# Patient Record
Sex: Female | Born: 1980 | Race: White | Hispanic: No | Marital: Married | Smoking: Never smoker
Health system: Southern US, Community
[De-identification: ages and names within clinical notes are randomized; demographics above are authoritative.]

## PROBLEM LIST (undated history)

## (undated) DIAGNOSIS — O26899 Other specified pregnancy related conditions, unspecified trimester: Secondary | ICD-10-CM

## (undated) DIAGNOSIS — Z6791 Unspecified blood type, Rh negative: Secondary | ICD-10-CM

## (undated) DIAGNOSIS — I872 Venous insufficiency (chronic) (peripheral): Secondary | ICD-10-CM

## (undated) HISTORY — DX: Other specified pregnancy related conditions, unspecified trimester: O26.899

## (undated) HISTORY — DX: Venous insufficiency (chronic) (peripheral): I87.2

## (undated) HISTORY — DX: Unspecified blood type, rh negative: Z67.91

## (undated) HISTORY — PX: GALLBLADDER SURGERY: SHX652

---

## 2004-06-04 DIAGNOSIS — O139 Gestational [pregnancy-induced] hypertension without significant proteinuria, unspecified trimester: Secondary | ICD-10-CM

## 2011-07-25 DIAGNOSIS — O0990 Supervision of high risk pregnancy, unspecified, unspecified trimester: Secondary | ICD-10-CM | POA: Insufficient documentation

## 2012-01-08 ENCOUNTER — Observation Stay: Payer: Self-pay | Admitting: Obstetrics and Gynecology

## 2012-01-08 LAB — FETAL FIBRONECTIN
Appearance: NORMAL
Fetal Fibronectin: NEGATIVE

## 2012-01-08 LAB — URINALYSIS, COMPLETE
Bacteria: NONE SEEN
Bilirubin,UR: NEGATIVE
Glucose,UR: NEGATIVE mg/dL (ref 0–75)
Ketone: NEGATIVE
Ph: 6 (ref 4.5–8.0)
RBC,UR: NONE SEEN /HPF (ref 0–5)
Squamous Epithelial: <1
WBC UR: 1 /HPF (ref 0–5)

## 2012-01-09 ENCOUNTER — Ambulatory Visit: Payer: Self-pay

## 2012-02-12 ENCOUNTER — Observation Stay: Payer: Self-pay | Admitting: Obstetrics and Gynecology

## 2012-02-12 LAB — URINALYSIS, COMPLETE
Blood: NEGATIVE
Glucose,UR: NEGATIVE mg/dL (ref 0–75)
Ketone: NEGATIVE
Nitrite: NEGATIVE
Ph: 7 (ref 4.5–8.0)
Protein: NEGATIVE
RBC,UR: NONE SEEN /HPF (ref 0–5)
Specific Gravity: 1.013 (ref 1.003–1.030)

## 2012-03-14 ENCOUNTER — Inpatient Hospital Stay: Payer: Self-pay

## 2012-03-14 LAB — CBC WITH DIFFERENTIAL/PLATELET
Basophil #: 0 10*3/uL (ref 0.0–0.1)
Basophil %: 0.5 %
Eosinophil %: 2.2 %
HCT: 36.6 % (ref 35.0–47.0)
HGB: 12.5 g/dL (ref 12.0–16.0)
Lymphocyte #: 1.4 10*3/uL (ref 1.0–3.6)
Lymphocyte %: 15.2 %
MCH: 31.4 pg (ref 26.0–34.0)
Monocyte #: 0.7 x10 3/mm (ref 0.2–0.9)
Neutrophil #: 7 10*3/uL — ABNORMAL HIGH (ref 1.4–6.5)
Neutrophil %: 74.7 %
RBC: 3.98 10*6/uL (ref 3.80–5.20)
WBC: 9.4 10*3/uL (ref 3.6–11.0)

## 2012-03-15 LAB — HEMATOCRIT: HCT: 29 % — ABNORMAL LOW (ref 35.0–47.0)

## 2012-03-30 ENCOUNTER — Emergency Department: Payer: Self-pay | Admitting: Emergency Medicine

## 2012-03-30 LAB — URINALYSIS, COMPLETE
Bacteria: NONE SEEN
Bilirubin,UR: NEGATIVE
Glucose,UR: NEGATIVE mg/dL (ref 0–75)
Ketone: NEGATIVE
Ph: 6 (ref 4.5–8.0)
RBC,UR: 338 /HPF (ref 0–5)
Specific Gravity: 1.015 (ref 1.003–1.030)
Squamous Epithelial: 3
WBC UR: 1299 /HPF (ref 0–5)

## 2012-03-30 LAB — COMPREHENSIVE METABOLIC PANEL
Albumin: 3.5 g/dL (ref 3.4–5.0)
Alkaline Phosphatase: 76 U/L (ref 50–136)
BUN: 13 mg/dL (ref 7–18)
Bilirubin,Total: 0.5 mg/dL (ref 0.2–1.0)
Chloride: 108 mmol/L — ABNORMAL HIGH (ref 98–107)
Creatinine: 1.04 mg/dL (ref 0.60–1.30)
EGFR (African American): >60
EGFR (Non-African Amer.): >60
Glucose: 95 mg/dL (ref 65–99)
Osmolality: 281 (ref 275–301)
Potassium: 3.8 mmol/L (ref 3.5–5.1)
SGOT(AST): 28 U/L (ref 15–37)
SGPT (ALT): 31 U/L (ref 12–78)
Sodium: 141 mmol/L (ref 136–145)
Total Protein: 7 g/dL (ref 6.4–8.2)

## 2012-03-30 LAB — CBC
HCT: 36.8 % (ref 35.0–47.0)
HGB: 12.8 g/dL (ref 12.0–16.0)
MCH: 31.4 pg (ref 26.0–34.0)
MCHC: 34.7 g/dL (ref 32.0–36.0)
MCV: 91 fL (ref 80–100)
Platelet: 270 10*3/uL (ref 150–440)
RBC: 4.07 10*6/uL (ref 3.80–5.20)

## 2013-04-28 ENCOUNTER — Emergency Department: Payer: Self-pay | Admitting: Emergency Medicine

## 2014-10-10 IMAGING — US US PELV - US TRANSVAGINAL
1 series · 13 of 25 positions shown · non-contrast
Comparison: none

REASON FOR EXAM: pelvic pain
COMMENTS:

PROCEDURE:     US  - US PELVIS EXAM W/TRANSVAGINAL  - March 30, 2012  [DATE]
RESULT:
TECHNIQUE: Transabdominal and transvaginal imaging of the pelvis was
obtained.

[Series 1: us pelv - us transvaginal · 0.30mm/px · 13 of 51 slices shown]
[im 1/51]
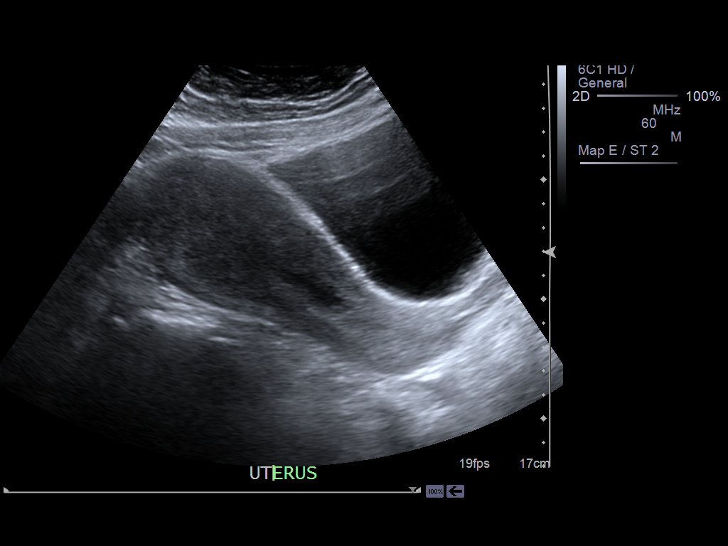
[im 5/51]
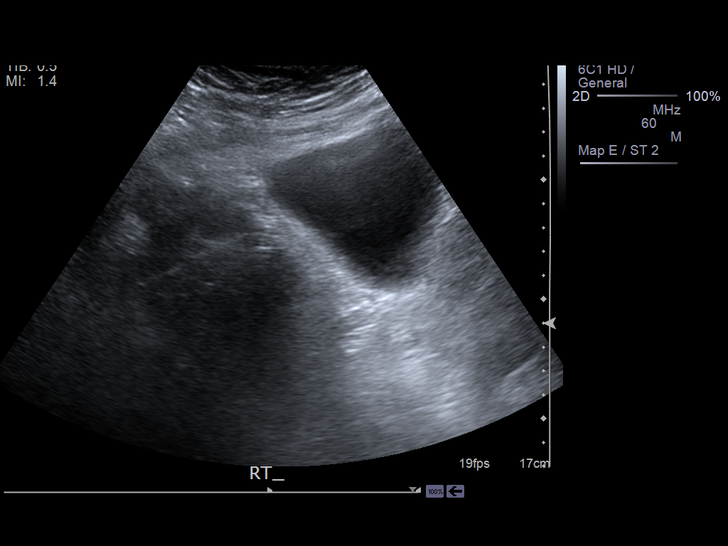
[im 9/51]
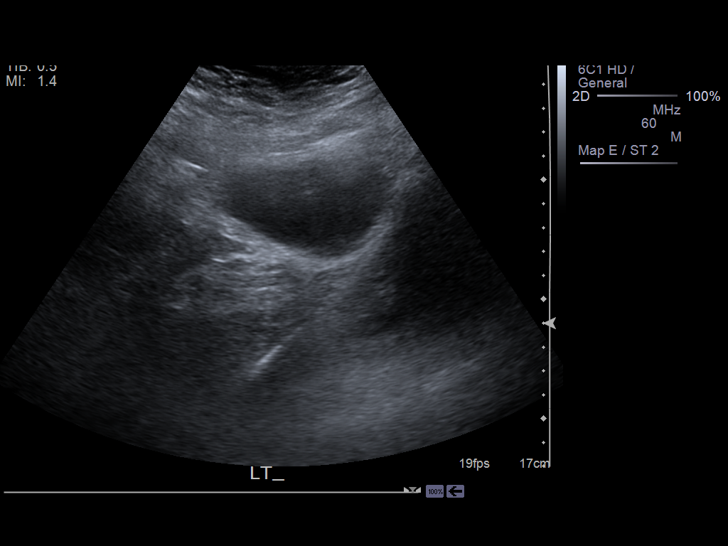
[im 13/51]
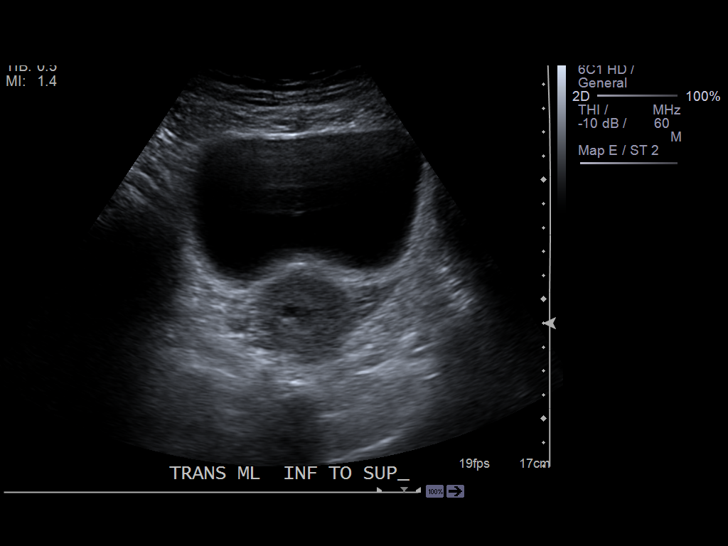
[im 17/51]
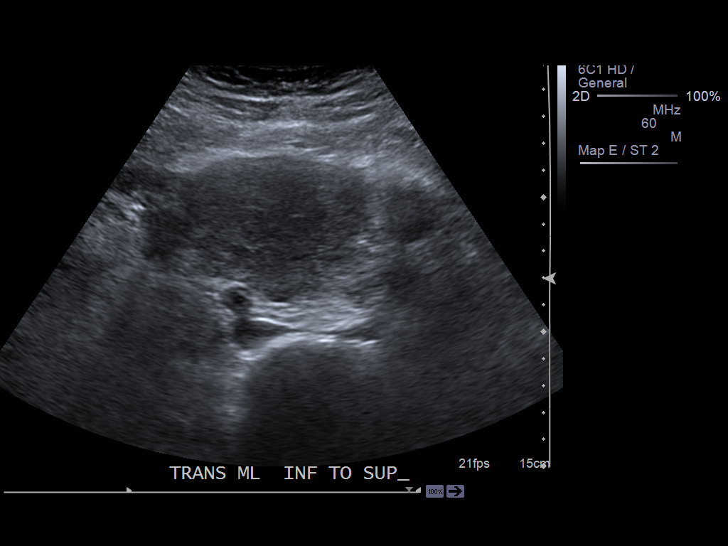
[im 21/51]
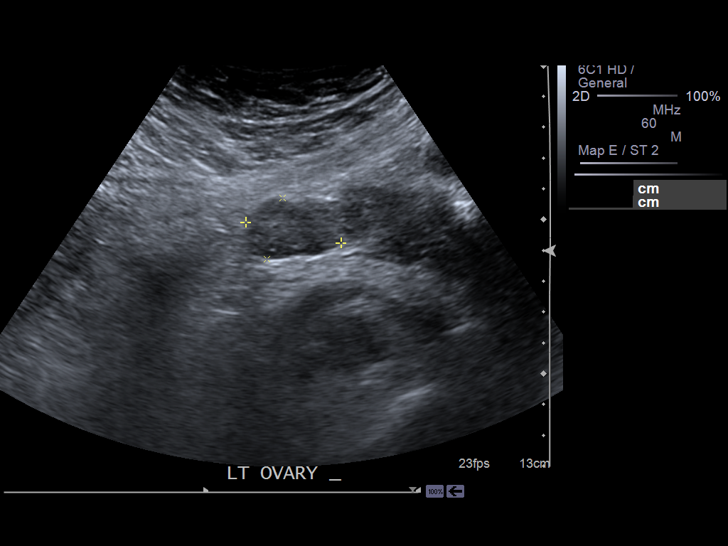
[im 26/51]
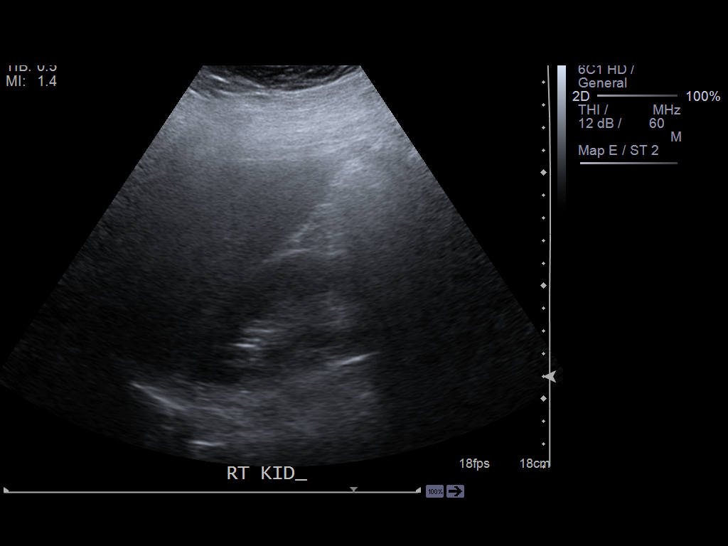
[im 30/51]
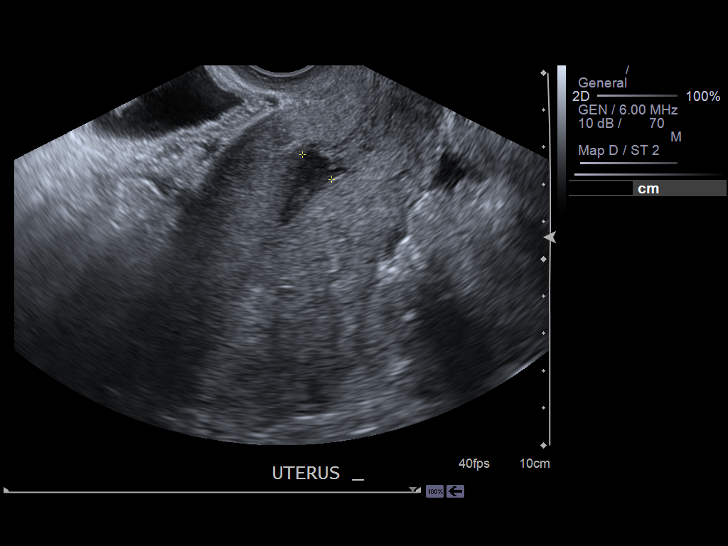
[im 34/51]
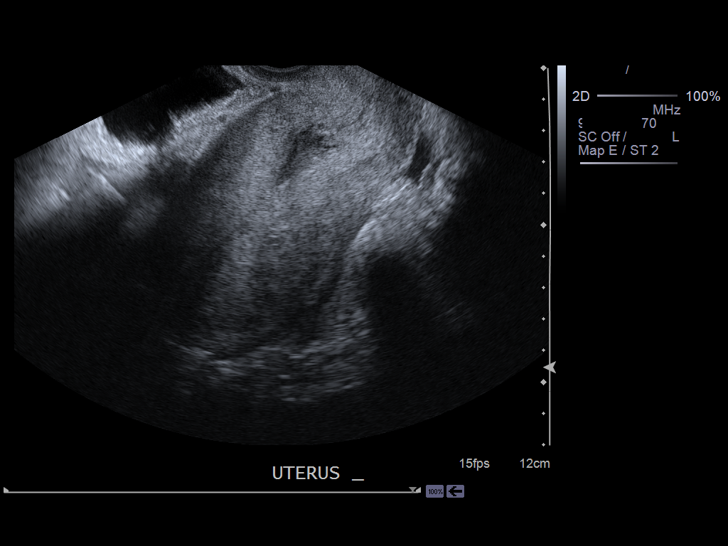
[im 38/51]
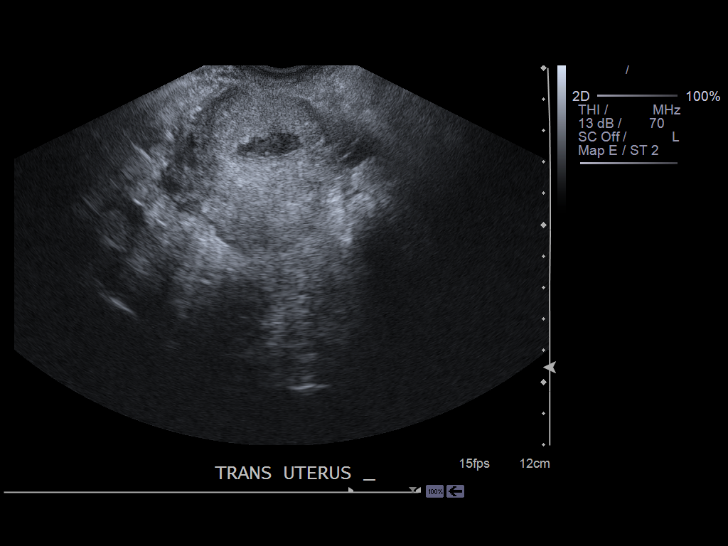
[im 42/51]
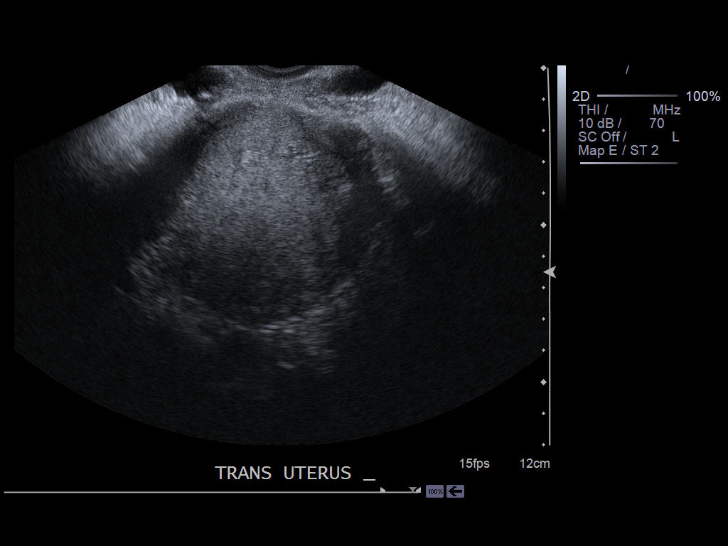
[im 46/51]
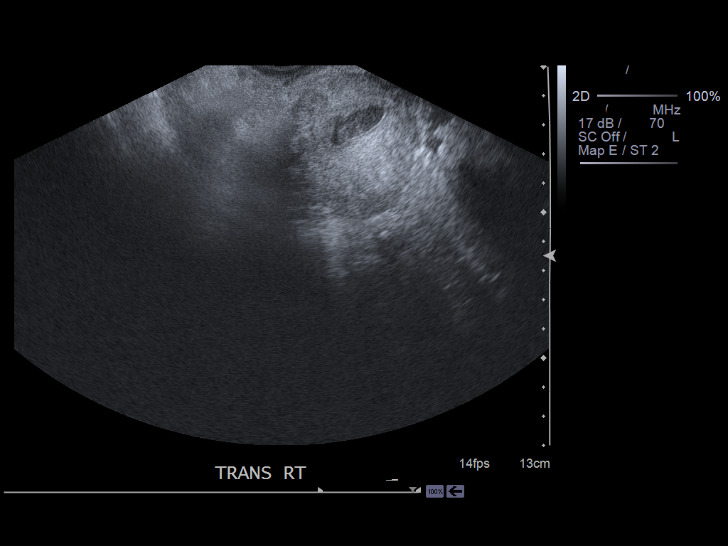
[im 51/51]
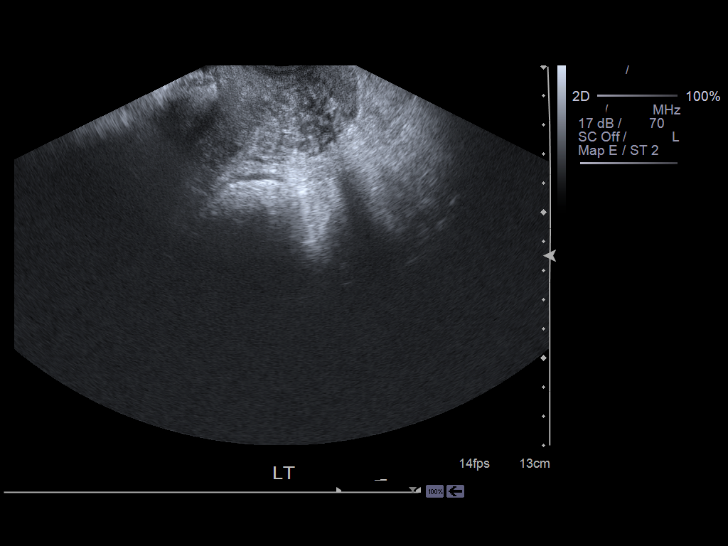

[13 of 25 positions shown; findings below may reference images not displayed]

FINDINGS: The uterus is enlarged consistent with a postpartum state. The
endometrium is thickened with complex echoes in the region of the fundus
measuring approximately 1.5-1.6 cm in thickness.  A complex collection is
identified in the endometrial canal and the lower uterine segment measuring
10 mm. Color Doppler images demonstrate no definitive internal vascularity
within the collection. The right ovary is unremarkable and measures 3.7 x
1.7 x 2.6 cm. The left ovary measures 3.2 x 2.0 x 2.1 cm. Flow is identified
within the ovaries. A trace amount of fluid is appreciated within the
cul-de-sac.
IMPRESSION: 1. Slightly thickened endometrium with complex fluid collection in the
endometrial canal in the lower uterine segment. Differential considerations
include infection, clot or possibly retained products of conception.
Surveillance evaluation recommended if and as clinically warranted.
2. No further sonographic abnormalities. A trace amount of free fluid is
identified within the cul-de-sac.
3. Dr. Nazareth of the Emergency Department was informed of these findings
via a preliminary faxed report.

## 2014-10-12 NOTE — H&P (Signed)
L&D Evaluation:  History Expanded:   HPI 34 yo G4P1021 who was seen in the office today and was found to be 2-3 cm dilated, and sent fromt he office. she has not had any problems with this pregnancy ashe is on her feet all day but with light dutyshe is having pressure and cramping. FFn was founf to be neg today.    Gravida 4    Term 1    PreTerm 0    Abortion 2    Living 1    Blood Type (Maternal) A negative    Group B Strep Results Maternal (Result >5wks must be treated as unknown) unknown/result > 5 weeks ago    Maternal HIV Negative    Maternal Syphilis Ab Nonreactive    Maternal Varicella Non-Immune    Rubella Results (Maternal) immune    Maternal T-Dap Unknown    [facility]    Presents with abdominal pain, contractions    Patient's Medical History No Chronic Illness    Patient's Surgical History none    Medications Pre Natal Vitamins    Allergies NKDA    Social History none    Family History Non-Contributory   ROS:   ROS All systems were reviewed.  HEENT, CNS, GI, GU, Respiratory, CV, Renal and Musculoskeletal systems were found to be normal.   Exam:   Vital Signs stable    Urine Protein negative dipstick    General no apparent distress    Mental Status clear    Chest clear    Heart normal sinus rhythm    Abdomen gravid, non-tender    Estimated Fetal Weight Average for gestational age    Pelvic no external lesions, 2 external    Mebranes Intact    FHT strictly reactive no decels or variables, no contractions seen    Ucx absent    Skin dry    Lymph no lymphadenopathy   Impression:   Impression PTL   Plan:   Plan monitor contractions and for cervical change    Comments give steroids proophy;lactically given dilation, return on 8th and 15th adn shoujld be able to go tovacation adn she shouldbe able to work on a limited basis pelvic rest rec. and given watery discharge will give rx for flagyl for presumed BV    Follow Up  Appointment already scheduled   Electronic Signatures: Adria DevonKlett, Kenroy Timberman (MD)  (Signed 06-Aug-13 17:59)  Authored: L&D Evaluation   Last Updated: 06-Aug-13 17:59 by Adria DevonKlett, Florence Yeung (MD)

## 2014-10-12 NOTE — H&P (Signed)
L&D Evaluation:  History Expanded:   HPI 34 yo G4P1021 with EDD of 03/09/12 presents at 40 5/7 weeks with c/o leaking fluid and regular contractions. [facility] for tx of care at 16 weeks, episode of PTL around 31 weeks with steroids given, RH negative with Rhogam given at 28 weeks. H/o PIH with last delivery.    Gravida 4    Term 1    PreTerm 0    Abortion 2    Living 1    Blood Type (Maternal) A negative    Group B Strep Results Maternal (Result >5wks must be treated as unknown) unknown/result > 5 weeks ago    Maternal HIV Negative    Maternal Syphilis Ab Nonreactive    Maternal Varicella Unknown    Rubella Results (Maternal) immune    Maternal T-Dap Unknown    [facility]    Presents with abdominal pain, contractions    Patient's Medical History No Chronic Illness  varicose veins    Patient's Surgical History Colecystectomy    Medications Pre Natal Vitamins    Allergies NKDA    Social History none    Family History Non-Contributory   ROS:   ROS see HPI   Exam:   Vital Signs stable    General no apparent distress    Mental Status clear    Chest clear    Heart normal sinus rhythm    Abdomen gravid, tender with contractions    Estimated Fetal Weight Average for gestational age    Edema 2+ pitting edema on LLE, 1+ RLE    Reflexes 1+    Pelvic no external lesions, 4 cm per RN    Mebranes Intact    FHT normal rate with no decels    Ucx regular, q 3-4 min    Skin dry    Lymph no lymphadenopathy   Impression:   Impression early labor   Plan:   Plan monitor contractions and for cervical change    Comments Admission for delivery May ambulate in hallways once NST reactive IV medication or epidural as desired   Electronic Signatures: Vella KohlerBrothers, Jamis Kryder K (CNM)  (Signed 11-Oct-13 05:31)  Authored: L&D Evaluation   Last Updated: 11-Oct-13 05:31 by Vella KohlerBrothers, Vilda Zollner K (CNM)

## 2016-11-09 ENCOUNTER — Ambulatory Visit (INDEPENDENT_AMBULATORY_CARE_PROVIDER_SITE_OTHER): Payer: BC Managed Care – PPO | Admitting: Certified Nurse Midwife

## 2016-11-09 ENCOUNTER — Encounter: Payer: Self-pay | Admitting: Certified Nurse Midwife

## 2016-11-09 VITALS — BP 110/72 | HR 75 | Ht 67.0 in | Wt 215.8 lb

## 2016-11-09 DIAGNOSIS — N340 Urethral abscess: Secondary | ICD-10-CM | POA: Diagnosis not present

## 2016-11-09 MED ORDER — SULFAMETHOXAZOLE-TRIMETHOPRIM 800-160 MG PO TABS: 1.0000 | ORAL_TABLET | Freq: Two times a day (BID) | ORAL | 0 refills | Status: AC

## 2016-11-09 MED ORDER — LIDOCAINE HCL 2 % EX GEL: 1.0000 "application " | CUTANEOUS | 2 refills | Status: DC | PRN

## 2016-11-09 NOTE — Patient Instructions (Addendum)
Bartholin Cyst or Abscess A Bartholin cyst is a fluid-filled sac that forms on a Bartholin gland. Bartholin glands are small glands that are located within the folds of skin (labia) along the sides of the lower opening of the vagina. These glands produce a fluid to moisten the outside of the vagina during sexual intercourse. A Bartholin cyst causes a bulge on the side of the vagina. A cyst that is not large or infected may not cause symptoms or problems. However, if the fluid within the cyst becomes infected, the cyst can turn into an abscess. An abscess may cause discomfort or pain. What are the causes? A Bartholin cyst may develop when the duct of the gland becomes blocked. In many cases, the cause of this is not known. Various kinds of bacteria can cause the cyst to become infected and develop into an abscess. What increases the risk? You may be at an increased risk of developing a Bartholin cyst or abscess if:  You are a woman of reproductive age.  You have a history of previous Bartholin cysts or abscesses.  You have diabetes.  You have a sexually transmitted disease (STD).  What are the signs or symptoms? The severity of symptoms varies depending on the size of the cyst and whether it is infected. Symptoms may include:  A bulge or swelling near the lower opening of your vagina.  Discomfort or pain.  Redness.  Pain during sexual intercourse.  Pain when walking.  Fluid draining from the area.  How is this diagnosed? Your health care provider may make a diagnosis based on your symptoms and a physical exam. He or she will look for swelling in your vaginal area. Blood tests may be done to check for infections. A sample of fluid from the cyst or abscess may also be taken to be tested in a lab. How is this treated? Small cysts that are not infected may not require any treatment. These often go away on their own. Yourhealth care provider will recommend hot baths and the use of warm  compresses. These may also be part of the treatment for an abscess. Treatment options for a large cyst or abscess may include:  Antibiotic medicine.  A surgical procedure to drain the abscess. One of the following procedures may be done: ? Incision and drainage. An incision is made in the cyst or abscess so that the fluid drains out. A catheter may be placed inside the cyst so that it does not close and fill up with fluid again. The catheter will be removed after you have a follow-up visit with a specialist (gynecologist). ? Marsupialization. The cyst or abscess is opened and kept open by stitching the edges of the skin to the walls of the cyst or abscess. This allows it to continue to drain and not fill up with fluid again.  If you have cysts or abscesses that keep returning and have required incision and drainage multiple times, your health care provider may talk to you about surgery to remove the Bartholin gland. Follow these instructions at home:  Take medicines only as directed by your health care provider.  If you were prescribed an antibiotic medicine, finish it all even if you start to feel better.  Apply warm, wet compresses to the area or take warm, shallow baths that cover your pelvic region (sitz baths) several times a day or as directed by your health care provider.  Do not squeeze the cyst or apply heavy pressure to it.    Do not have sexual intercourse until the cyst has gone away.  If your cyst or abscess was opened, a small piece of gauze or a drain may have been placed in the area to allow drainage. Do not remove the gauze or the drain until directed by your health care provider.  Wear feminine pads-not tampons-as needed for any drainage or bleeding.  Keep all follow-up visits as directed by your health care provider. This is important. How is this prevented? Take these steps to help prevent a Bartholin cyst from returning:  Practice good hygiene.  Clean your vaginal  area with mild soap and a soft cloth when you bathe.  Practice safe sex to prevent STDs.  Contact a health care provider if:  You have increased pain, swelling, or redness in the area of the cyst.  Puslike drainage is coming from the cyst.  You have a fever. This information is not intended to replace advice given to you by your health care provider. Make sure you discuss any questions you have with your health care provider. Document Released: 05/21/2005 Document Revised: 10/27/2015 Document Reviewed: 01/04/2014 Elsevier Interactive Patient Education  2018 Elsevier Inc. Sulfamethoxazole; Trimethoprim, SMX-TMP tablets What is this medicine? SULFAMETHOXAZOLE; TRIMETHOPRIM or SMX-TMP (suhl fuh meth OK suh zohl; trye METH oh prim) is a combination of a sulfonamide antibiotic and a second antibiotic, trimethoprim. It is used to treat or prevent certain kinds of bacterial infections. It will not work for colds, flu, or other viral infections. This medicine may be used for other purposes; ask your health care provider or pharmacist if you have questions. COMMON BRAND NAME(S): Bacter-Aid DS, Bactrim, Bactrim DS, Septra, Septra DS What should I tell my health care provider before I take this medicine? They need to know if you have any of these conditions: -anemia -asthma -being treated with anticonvulsants -if you frequently drink alcohol containing drinks -kidney disease -liver disease -low level of folic acid or ZOXWRUE-4-[id number] dehydrogenase -poor nutrition or malabsorption -porphyria -severe allergies -thyroid disorder -an unusual or allergic reaction to sulfamethoxazole, trimethoprim, sulfa drugs, other medicines, foods, dyes, or preservatives -pregnant or trying to get pregnant -breast-feeding How should I use this medicine? Take this medicine by mouth with a full glass of water. Follow the directions on the prescription label. Take your medicine at regular intervals. Do not  take it more often than directed. Do not skip doses or stop your medicine early. Talk to your pediatrician regarding the use of this medicine in children. Special care may be needed. This medicine has been used in children as young as 56 months of age. Overdosage: If you think you have taken too much of this medicine contact a poison control center or emergency room at once. NOTE: This medicine is only for you. Do not share this medicine with others. What if I miss a dose? If you miss a dose, take it as soon as you can. If it is almost time for your next dose, take only that dose. Do not take double or extra doses. What may interact with this medicine? Do not take this medicine with any of the following medications: -aminobenzoate potassium -dofetilide -metronidazole This medicine may also interact with the following medications: -ACE inhibitors like benazepril, enalapril, lisinopril, and ramipril -birth control pills -cyclosporine -digoxin -diuretics -indomethacin -medicines for diabetes -methenamine -methotrexate -phenytoin -potassium supplements -pyrimethamine -sulfinpyrazone -tricyclic antidepressants -warfarin This list may not describe all possible interactions. Give your health care provider a list of all the medicines, herbs, non-prescription  drugs, or dietary supplements you use. Also tell them if you smoke, drink alcohol, or use illegal drugs. Some items may interact with your medicine. What should I watch for while using this medicine? Tell your doctor or health care professional if your symptoms do not improve. Drink several glasses of water a day to reduce the risk of kidney problems. Do not treat diarrhea with over the counter products. Contact your doctor if you have diarrhea that lasts more than 2 days or if it is severe and watery. This medicine can make you more sensitive to the sun. Keep out of the sun. If you cannot avoid being in the sun, wear protective clothing  and use a sunscreen. Do not use sun lamps or tanning beds/booths. What side effects may I notice from receiving this medicine? Side effects that you should report to your doctor or health care professional as soon as possible: -allergic reactions like skin rash or hives, swelling of the face, lips, or tongue -breathing problems -fever or chills, sore throat -irregular heartbeat, chest pain -joint or muscle pain -pain or difficulty passing urine -red pinpoint spots on skin -redness, blistering, peeling or loosening of the skin, including inside the mouth -unusual bleeding or bruising -unusually weak or tired -yellowing of the eyes or skin Side effects that usually do not require medical attention (report to your doctor or health care professional if they continue or are bothersome): -diarrhea -dizziness -headache -loss of appetite -nausea, vomiting -nervousness This list may not describe all possible side effects. Call your doctor for medical advice about side effects. You may report side effects to FDA at 1-800-FDA-1088. Where should I keep my medicine? Keep out of the reach of children. Store at room temperature between 20 to 25 degrees C (68 to 77 degrees F). Protect from light. Throw away any unused medicine after the expiration date. NOTE: This sheet is a summary. It may not cover all possible information. If you have questions about this medicine, talk to your doctor, pharmacist, or health care provider.  2018 Elsevier/Gold Standard (2012-12-26 14:38:26)   How to Take a Sitz Bath A sitz bath is a warm water bath that is taken while you are sitting down. The water should only come up to your hips and should cover your buttocks. Your health care provider may recommend a sitz bath to help you:  Clean the lower part of your body, including your genital area.  With itching.  With pain.  With sore muscles or muscles that tighten or spasm.  How to take a sitz bath Take 3-4  sitz baths per day or as told by your health care provider. 1. Partially fill a bathtub with warm water. You will only need the water to be deep enough to cover your hips and buttocks when you are sitting in it. 2. If your health care provider told you to put medicine in the water, follow the directions exactly. 3. Sit in the water and open the tub drain a little. 4. Turn on the warm water again to keep the tub at the correct level. Keep the water running constantly. 5. Soak in the water for 15-20 minutes or as told by your health care provider. 6. After the sitz bath, pat the affected area dry first. Do not rub it. 7. Be careful when you stand up after the sitz bath because you may feel dizzy.  Contact a health care provider if:  Your symptoms get worse. Do not continue with sitz baths  if your symptoms get worse.  You have new symptoms. Do not continue with sitz baths until you talk with your health care provider. This information is not intended to replace advice given to you by your health care provider. Make sure you discuss any questions you have with your health care provider. Document Released: 02/11/2004 Document Revised: 10/19/2015 Document Reviewed: 05/19/2014 Elsevier Interactive Patient Education  [employer]2018 Elsevier Inc.

## 2016-11-11 NOTE — Progress Notes (Signed)
Nicole Jarvis is a 36 y.o. year old [id number] Caucasian female who presents for incision and drainage of right skene's gland abscess. She reports a painful, tender, red, dime sized "bump" on her right vulva. She reports minimal relief with home measures.   No LMP recorded (lmp unknown). BP 110/72   Pulse 75   Ht 5\' 7"  (1.702 m)   Wt 215 lb 12.8 oz (97.9 kg)   LMP  (LMP Unknown) Comment: mirena- placed -2014  BMI 33.80 kg/m   Verbal consent was obtained for incision and drainage of right skene's gland abscess. A time out was preformed.   The area was cleansed with betadine and anesthetized with 2cc of 2% Lidocaine.  The area was cleansed again with betadine and two (2) "pinprick" incisions were created at the top and bottom of the abscess. The purulent material was expressed. Swab obtained and culture sent to lab. Bleeding was minimal. A wet to dry gauze dressing was applied.  The patient tolerated the procedure well without complications. Standard post procedure care and return precautions explained.   Rx Bactrim and Lidocaine jelly, see orders.   RTC x 2-3 days for wound/incision check.    Gunnar BullaJenkins Michelle Toua Stites, CNM

## 2016-11-13 LAB — ANAEROBIC AND AEROBIC CULTURE

## 2016-11-13 LAB — GENITAL CULTURE

## 2018-04-01 ENCOUNTER — Ambulatory Visit (INDEPENDENT_AMBULATORY_CARE_PROVIDER_SITE_OTHER): Payer: BC Managed Care – PPO | Admitting: Certified Nurse Midwife

## 2018-04-01 VITALS — BP 118/71 | HR 75 | Ht 67.0 in | Wt 217.3 lb

## 2018-04-01 DIAGNOSIS — Z30432 Encounter for removal of intrauterine contraceptive device: Secondary | ICD-10-CM

## 2018-04-01 NOTE — Patient Instructions (Signed)

## 2018-04-01 NOTE — Progress Notes (Signed)
  GYNECOLOGY OFFICE PROCEDURE NOTE  PIERA DOWNS is a 37 y.o. [phone] here for Mirena IUD removal. No GYN concerns.  Pap: its been a while. Has appointment in a week for annual physical exam.   IUD Removal  Patient identified, informed consent performed, consent signed.  Patient was in the dorsal lithotomy position, normal external genitalia was noted.  A speculum was placed in the patient's vagina, normal discharge was noted, no lesions. The cervix was visualized, no lesions, no abnormal discharge.  The strings of the IUD were grasped and pulled using ring forceps. The IUD was removed in its entirety. Patient tolerated the procedure well.    . She is undecided on birth control at this time. Reviewed Patch, Pill, ring, depo, nexplanon, family planning, condoms, and sterilization.Patient will use condoms for contraception   Follow up as scheduled for annual exam. Routine preventative health maintenance measures emphasized.  Doreene Burke, CNM

## 2018-04-08 ENCOUNTER — Encounter: Payer: BC Managed Care – PPO | Admitting: Certified Nurse Midwife

## 2018-04-14 ENCOUNTER — Other Ambulatory Visit (HOSPITAL_COMMUNITY)
Admission: RE | Admit: 2018-04-14 | Discharge: 2018-04-14 | Disposition: A | Discharge status: Home / Self Care | Payer: BC Managed Care – PPO | Source: Ambulatory Visit | Attending: Certified Nurse Midwife | Admitting: Certified Nurse Midwife

## 2018-04-14 ENCOUNTER — Ambulatory Visit (INDEPENDENT_AMBULATORY_CARE_PROVIDER_SITE_OTHER): Payer: BC Managed Care – PPO | Admitting: Certified Nurse Midwife

## 2018-04-14 ENCOUNTER — Encounter: Payer: Self-pay | Admitting: Certified Nurse Midwife

## 2018-04-14 VITALS — BP 120/80 | HR 74 | Ht 67.0 in | Wt 218.4 lb

## 2018-04-14 DIAGNOSIS — Z124 Encounter for screening for malignant neoplasm of cervix: Secondary | ICD-10-CM

## 2018-04-14 DIAGNOSIS — Z01419 Encounter for gynecological examination (general) (routine) without abnormal findings: Secondary | ICD-10-CM | POA: Diagnosis not present

## 2018-04-14 NOTE — Progress Notes (Signed)
GYNECOLOGY ANNUAL PREVENTATIVE CARE ENCOUNTER NOTE  Subjective:   Nicole Jarvis is a 37 y.o. [phone] female here for a routine annual gynecologic exam.  Current complaints: none.   Denies abnormal vaginal bleeding, discharge, pelvic pain, problems with intercourse or other gynecologic concerns. 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    Gynecologic History Patient's last menstrual period was 03/26/2018 (approximate). Contraception: none Last Pap: 2014. Results were: normal per pt Last mammogram: N/A  Obstetric History OB History  Gravida Para Term Preterm AB Living  4 2 2   2 2   SAB TAB Ectopic Multiple Live Births  2       2    # Outcome Date GA Lbr Len/2nd Weight Sex Delivery Anes PTL Lv  4 Term 2013   8 lb 12.8 oz (3.992 kg) M Vag-Spont   LIV  3 SAB 2012          2 Term 2006   5 lb 6.4 oz (2.449 kg) M Vag-Spont   LIV     Complications: Pregnancy induced hypertension  1 SAB 2005            Past Medical History:  Diagnosis Date  . Rh negative, maternal     Past Surgical History:  Procedure Laterality Date  . GALLBLADDER SURGERY      No current outpatient medications on file prior to visit.   No current facility-administered medications on file prior to visit.     No Known Allergies  Social History:  reports that she has never smoked. She has never used smokeless tobacco. She reports that she drinks alcohol. She reports that she does not use drugs. Denies drugs, alcohol, and smoking. Exercises 2-3 times a week. Eats "clean diet" and uses meal planning.   She is a Dentist  Family History  Problem Relation Age of Onset  . Colon cancer Maternal Grandmother   . Breast cancer Neg Hx   . Ovarian cancer Neg Hx   . Diabetes Neg Hx   . Heart disease Neg Hx     The following portions of the patient's history were reviewed and updated as appropriate: allergies, current medications, past family history, past medical history,  past social history, past surgical history and problem list.  Review of Systems Pertinent items noted in HPI and remainder of comprehensive ROS otherwise negative.   Objective:  BP 120/80   Pulse 74   Ht 5\' 7"  (1.702 m)   Wt 218 lb 6 oz (99.1 kg)   LMP 03/26/2018 (Approximate)   BMI 34.20 kg/m  CONSTITUTIONAL: Well-developed, well-nourished female in no acute distress.  HENT:  Normocephalic, atraumatic, External right and left ear normal. Oropharynx is clear and moist EYES: Conjunctivae and EOM are normal. Pupils are equal, round, and reactive to light. No scleral icterus.  NECK: Normal range of motion, supple, no masses.  Normal thyroid.  SKIN: Skin is warm and dry. No rash noted. Not diaphoretic. No erythema. No pallor. MUSCULOSKELETAL: Normal range of motion. No tenderness.  No cyanosis, clubbing, or edema.  2+ distal pulses. NEUROLOGIC: Alert and oriented to person, place, and time. Normal reflexes, muscle tone coordination. No cranial nerve deficit noted. PSYCHIATRIC: Normal mood and affect. Normal behavior. Normal judgment and thought content. CARDIOVASCULAR: Normal heart rate noted, regular rhythm RESPIRATORY: Clear to auscultation bilaterally. Effort and breath sounds normal, no problems with respiration noted. BREASTS: Symmetric in size. No masses, skin changes, nipple drainage, or lymphadenopathy. ABDOMEN:  Soft, normal bowel sounds, no distention noted.  No tenderness, rebound or guarding.  PELVIC: Normal appearing external genitalia; normal appearing vaginal mucosa and cervix.  No abnormal discharge noted.  Pap smear obtained. Contract bleeding present. Normal uterine size, no other palpable masses, no uterine or adnexal tenderness.    Assessment and Plan:  Well women exam Will follow up results of pap smear and manage accordingly. Mammogram not indicated Labs: pt states she has had lab (lipid profile, thyroid) completed at PCP Discussed PNV and extra folic acid for  pregnancy. Samples given.  Routine preventative health maintenance measures emphasized. Please refer to After Visit Summary for other counseling recommendations.    Doreene Burke, CNM

## 2018-04-14 NOTE — Patient Instructions (Signed)
Preventive Care 18-39 Years, Female Preventive care refers to lifestyle choices and visits with your health care provider that can promote health and wellness. What does preventive care include?  A yearly physical exam. This is also called an annual well check.  Dental exams once or twice a year.  Routine eye exams. Ask your health care provider how often you should have your eyes checked.  Personal lifestyle choices, including: ? Daily care of your teeth and gums. ? Regular physical activity. ? Eating a healthy diet. ? Avoiding tobacco and drug use. ? Limiting alcohol use. ? Practicing safe sex. ? Taking vitamin and mineral supplements as recommended by your health care provider. What happens during an annual well check? The services and screenings done by your health care provider during your annual well check will depend on your age, overall health, lifestyle risk factors, and family history of disease. Counseling Your health care provider may ask you questions about your:  Alcohol use.  Tobacco use.  Drug use.  Emotional well-being.  Home and relationship well-being.  Sexual activity.  Eating habits.  Work and work Statistician.  Method of birth control.  Menstrual cycle.  Pregnancy history.  Screening You may have the following tests or measurements:  Height, weight, and BMI.  Diabetes screening. This is done by checking your blood sugar (glucose) after you have not eaten for a while (fasting).  Blood pressure.  Lipid and cholesterol levels. These may be checked every 5 years starting at age 66.  Skin check.  Hepatitis C blood test.  Hepatitis B blood test.  Sexually transmitted disease (STD) testing.  BRCA-related cancer screening. This may be done if you have a family history of breast, ovarian, tubal, or peritoneal cancers.  Pelvic exam and Pap test. This may be done every 3 years starting at age 40. Starting at age 59, this may be done every 5  years if you have a Pap test in combination with an HPV test.  Discuss your test results, treatment options, and if necessary, the need for more tests with your health care provider. Vaccines Your health care provider may recommend certain vaccines, such as:  Influenza vaccine. This is recommended every year.  Tetanus, diphtheria, and acellular pertussis (Tdap, Td) vaccine. You may need a Td booster every 10 years.  Varicella vaccine. You may need this if you have not been vaccinated.  HPV vaccine. If you are 69 or younger, you may need three doses over 6 months.  Measles, mumps, and rubella (MMR) vaccine. You may need at least one dose of MMR. You may also need a second dose.  Pneumococcal 13-valent conjugate (PCV13) vaccine. You may need this if you have certain conditions and were not previously vaccinated.  Pneumococcal polysaccharide (PPSV23) vaccine. You may need one or two doses if you smoke cigarettes or if you have certain conditions.  Meningococcal vaccine. One dose is recommended if you are age 27-21 years and a first-year college student living in a residence hall, or if you have one of several medical conditions. You may also need additional booster doses.  Hepatitis A vaccine. You may need this if you have certain conditions or if you travel or work in places where you may be exposed to hepatitis A.  Hepatitis B vaccine. You may need this if you have certain conditions or if you travel or work in places where you may be exposed to hepatitis B.  Haemophilus influenzae type b (Hib) vaccine. You may need this if  you have certain risk factors.  Talk to your health care provider about which screenings and vaccines you need and how often you need them. This information is not intended to replace advice given to you by your health care provider. Make sure you discuss any questions you have with your health care provider. Document Released: 07/17/2001 Document Revised: 02/08/2016  Document Reviewed: 03/22/2015 Elsevier Interactive Patient Education  [employer].

## 2018-04-17 LAB — CYTOLOGY - PAP
DIAGNOSIS: UNDETERMINED — AB
HPV (WINDOPATH): NOT DETECTED

## 2018-07-31 ENCOUNTER — Encounter: Payer: Self-pay | Admitting: Emergency Medicine

## 2018-07-31 ENCOUNTER — Emergency Department: Payer: BC Managed Care – PPO

## 2018-07-31 ENCOUNTER — Other Ambulatory Visit: Payer: Self-pay

## 2018-07-31 ENCOUNTER — Emergency Department
Admission: EM | Admit: 2018-07-31 | Discharge: 2018-07-31 | Disposition: A | Discharge status: Home / Self Care | Payer: BC Managed Care – PPO | Attending: Emergency Medicine | Admitting: Emergency Medicine

## 2018-07-31 DIAGNOSIS — R1030 Lower abdominal pain, unspecified: Secondary | ICD-10-CM | POA: Diagnosis not present

## 2018-07-31 DIAGNOSIS — N83209 Unspecified ovarian cyst, unspecified side: Secondary | ICD-10-CM | POA: Diagnosis not present

## 2018-07-31 DIAGNOSIS — R109 Unspecified abdominal pain: Secondary | ICD-10-CM | POA: Diagnosis present

## 2018-07-31 LAB — URINALYSIS, COMPLETE (UACMP) WITH MICROSCOPIC
Bilirubin Urine: NEGATIVE
Glucose, UA: NEGATIVE mg/dL
Ketones, ur: NEGATIVE mg/dL
Nitrite: NEGATIVE
Protein, ur: NEGATIVE mg/dL
Specific Gravity, Urine: 1.005 (ref 1.005–1.030)
pH: 6 (ref 5.0–8.0)

## 2018-07-31 LAB — CBC
HCT: 36.8 % (ref 36.0–46.0)
Hemoglobin: 12.6 g/dL (ref 12.0–15.0)
MCH: 32 pg (ref 26.0–34.0)
MCHC: 34.2 g/dL (ref 30.0–36.0)
MCV: 93.4 fL (ref 80.0–100.0)
Platelets: 202 10*3/uL (ref 150–400)
RBC: 3.94 MIL/uL (ref 3.87–5.11)
RDW: 11.6 % (ref 11.5–15.5)
WBC: 7.1 10*3/uL (ref 4.0–10.5)
nRBC: 0 % (ref 0.0–0.2)

## 2018-07-31 LAB — COMPREHENSIVE METABOLIC PANEL
ALT: 15 U/L (ref 0–44)
AST: 19 U/L (ref 15–41)
Albumin: 4.1 g/dL (ref 3.5–5.0)
Alkaline Phosphatase: 30 U/L — ABNORMAL LOW (ref 38–126)
Anion gap: 4 — ABNORMAL LOW (ref 5–15)
BUN: 20 mg/dL (ref 6–20)
CO2: 26 mmol/L (ref 22–32)
Calcium: 8.7 mg/dL — ABNORMAL LOW (ref 8.9–10.3)
Chloride: 106 mmol/L (ref 98–111)
Creatinine, Ser: 0.66 mg/dL (ref 0.44–1.00)
GFR calc Af Amer: >60 mL/min (ref >60)
GFR calc non Af Amer: >60 mL/min (ref >60)
Glucose, Bld: 88 mg/dL (ref 70–99)
Potassium: 4.1 mmol/L (ref 3.5–5.1)
SODIUM: 136 mmol/L (ref 135–145)
Total Bilirubin: 0.5 mg/dL (ref 0.3–1.2)
Total Protein: 6.6 g/dL (ref 6.5–8.1)

## 2018-07-31 LAB — LIPASE, BLOOD: Lipase: 31 U/L (ref 11–51)

## 2018-07-31 MED ORDER — OXYCODONE-ACETAMINOPHEN 7.5-325 MG PO TABS: 1.0000 | ORAL_TABLET | Freq: Four times a day (QID) | ORAL | 0 refills | Status: DC | PRN

## 2018-07-31 MED ORDER — KETOROLAC TROMETHAMINE 30 MG/ML IJ SOLN
30.0000 mg | Freq: Once | INTRAMUSCULAR | Status: AC
  Administered 2018-07-31: 30 mg via INTRAVENOUS
  Filled 2018-07-31: qty 1

## 2018-07-31 MED ORDER — ONDANSETRON HCL 4 MG/2ML IJ SOLN
4.0000 mg | Freq: Once | INTRAMUSCULAR | Status: AC
  Administered 2018-07-31: 4 mg via INTRAVENOUS
  Filled 2018-07-31: qty 2

## 2018-07-31 MED ORDER — HYDROMORPHONE HCL 1 MG/ML IJ SOLN
1.0000 mg | Freq: Once | INTRAMUSCULAR | Status: AC
  Administered 2018-07-31: 1 mg via INTRAVENOUS
  Filled 2018-07-31: qty 1

## 2018-07-31 MED ORDER — SODIUM CHLORIDE 0.9% FLUSH: 3.0000 mL | Freq: Once | INTRAVENOUS | Status: DC

## 2018-07-31 NOTE — ED Triage Notes (Addendum)
PT c/o RT flank pain xfew days with urinary frequency. PT also states intermit sharp pain through lower abd. Denies n/v/d . VSS NAD noted

## 2018-07-31 NOTE — Discharge Instructions (Signed)
Follow-up with gynecologist or family doctor in 3 to 5 days.

## 2018-07-31 NOTE — ED Notes (Signed)
See triage note; flank pain + dysuria since last night.

## 2018-07-31 NOTE — ED Provider Notes (Signed)
[facility] Emergency Department Provider Note   ____________________________________________   First MD Initiated Contact with Patient 07/31/18 1725     (approximate)  I have reviewed the triage vital signs and the nursing notes.   HISTORY  Chief Complaint Flank Pain    HPI Nicole Jarvis is a 38 y.o. female patient presents with right flank pain for few days.  Patient state in the last 24 hours she had urinary frequency.  Patient had a pain radiates from the right flank into the lower abdomen.  Patient denies nausea, vomiting, diarrhea.  Patient denies provoking incident for complaint.  Patient rates the pain as a 5/10.  Patient described the pain is "achy".  Patient has taken ibuprofen with mild transient relief.    Past Medical History:  Diagnosis Date  . Rh negative, maternal     There are no active problems to display for this patient.   Past Surgical History:  Procedure Laterality Date  . GALLBLADDER SURGERY      Prior to Admission medications   Medication Sig Start Date End Date Taking? Authorizing Provider  albuterol (PROVENTIL) (2.5 MG/3ML) 0.083% nebulizer solution Take 2.5 mg by nebulization every 6 (six) hours as needed for wheezing or shortness of breath.    [provider]  oxyCODONE-acetaminophen (PERCOCET) 7.5-325 MG tablet Take 1 tablet by mouth every 6 (six) hours as needed. 07/31/18   Joni Reining, PA-C    Allergies Patient has no known allergies.  Family History  Problem Relation Age of Onset  . Colon cancer Maternal Grandmother   . Breast cancer Neg Hx   . Ovarian cancer Neg Hx   . Diabetes Neg Hx   . Heart disease Neg Hx     Social History Social History   Tobacco Use  . Smoking status: Never Smoker  . Smokeless tobacco: Never Used  Substance Use Topics  . Alcohol use: Yes    Comment: occas  . Drug use: No    Review of Systems Constitutional: No fever/chills Eyes: No visual changes. ENT:  No sore throat. Cardiovascular: Denies chest pain. Respiratory: Denies shortness of breath. Gastrointestinal: No abdominal pain.  No nausea, no vomiting.  No diarrhea.  No constipation. Genitourinary: Urinary frequency. Musculoskeletal: Right flank pain. Skin: Negative for rash. Neurological: Negative for headaches, focal weakness or numbness.   ____________________________________________   PHYSICAL EXAM:  VITAL SIGNS: ED Triage Vitals [07/31/18 1512]  Enc Vitals Group     BP 129/82     Pulse Rate 71     Resp 16     Temp 98.4 F (36.9 C)     Temp Source Oral     SpO2 100 %     Weight      Height      Head Circumference      Peak Flow      Pain Score 5     Pain Loc      Pain Edu?      Excl. in GC?    Constitutional: Alert and oriented. Well appearing and in no acute distress. Cardiovascular: Normal rate, regular rhythm. Grossly normal heart sounds.  Good peripheral circulation. Respiratory: Normal respiratory effort.  No retractions. Lungs CTAB. Gastrointestinal: Soft and nontender. No distention. No abdominal bruits.  Right CVA tenderness. Musculoskeletal: No lower extremity tenderness nor edema.  No joint effusions. Neurologic:  Normal speech and language. No gross focal neurologic deficits are appreciated. No gait instability. Skin:  Skin is warm, dry and  intact. No rash noted. Psychiatric: Mood and affect are normal. Speech and behavior are normal.  ____________________________________________   LABS (all labs ordered are listed, but only abnormal results are displayed)  Labs Reviewed  COMPREHENSIVE METABOLIC PANEL - Abnormal; Notable for the following components:      Result Value   Calcium 8.7 (*)    Alkaline Phosphatase 30 (*)    Anion gap 4 (*)    All other components within normal limits  URINALYSIS, COMPLETE (UACMP) WITH MICROSCOPIC - Abnormal; Notable for the following components:   Color, Urine COLORLESS (*)    APPearance CLEAR (*)    Hgb urine  dipstick SMALL (*)    Leukocytes,Ua SMALL (*)    Bacteria, UA RARE (*)    All other components within normal limits  LIPASE, BLOOD  CBC  POC URINE PREG, ED   ____________________________________________  EKG   ____________________________________________  RADIOLOGY  ED MD interpretation:    Official radiology report(s): Ct Renal Stone Study  Result Date: 07/31/2018 CLINICAL DATA:  PT c/o RT flank pain xfew days with urinary frequency. PT also states intermit sharp pain through lower abd. Denies n/v/d . No hx of kidney stones. EXAM: CT ABDOMEN AND PELVIS WITHOUT CONTRAST TECHNIQUE: Multidetector CT imaging of the abdomen and pelvis was performed following the standard protocol without IV contrast. COMPARISON:  None. FINDINGS: Lower chest: Clear lung bases.  Heart normal in size. Hepatobiliary: Normal liver. Status post cholecystectomy. Dilation of the common bile duct, maximum 9 mm, with distal tapering. Pancreas: Unremarkable. No pancreatic ductal dilatation or surrounding inflammatory changes. Spleen: Normal in size without focal abnormality. Adrenals/Urinary Tract: Adrenal glands are unremarkable. Kidneys are normal, without renal calculi, focal lesion, or hydronephrosis. Bladder is unremarkable. Stomach/Bowel: Stomach is unremarkable. Small bowel and colon are normal in caliber. No wall thickening or inflammation. Mild increased stool burden noted throughout the colon. Normal appendix visualized. Vascular/Lymphatic: No significant vascular findings are present. No enlarged abdominal or pelvic lymph nodes. Reproductive: Uterus and bilateral adnexa are unremarkable. There is a small amount of pelvic free fluid, mostly in the cul-de-sac, with Hounsfield units averaging 40. Other: No hernia. Musculoskeletal: No fracture or acute finding. No osteoblastic or osteolytic lesions. IMPRESSION: 1. Small amount of relatively high attenuation pelvic free fluid, suggesting hemorrhage. This may be from a  ruptured ovarian cyst. 2. No other evidence of an acute abnormality. No renal or ureteral stones or obstructive uropathy. No other findings to account for the patient's symptoms. 3. Status post cholecystectomy. Mild chronic dilation of common bile duct. Electronically Signed   By: Amie Portlandavid  Ormond M.D.   On: 07/31/2018 19:53    ____________________________________________   PROCEDURES  Procedure(s) performed (including Critical Care):  Procedures   ____________________________________________   INITIAL IMPRESSION / ASSESSMENT AND PLAN / ED COURSE  As part of my medical decision making, I reviewed the following data within the electronic MEDICAL RECORD NUMBER     Patient presents with flank and right lower pelvic pain for acute onset of started yesterday.  Patient CT scan is suggestive of a ruptured ovarian cyst.  No findings consistent with kidney stones.  Patient given discharge care instructions and advised to follow-up with treating gynecologist or PCP.     ____________________________________________   FINAL CLINICAL IMPRESSION(S) / ED DIAGNOSES  Final diagnoses:  Ruptured ovarian cyst     ED Discharge Orders         Ordered    oxyCODONE-acetaminophen (PERCOCET) 7.5-325 MG tablet  Every 6 hours PRN  07/31/18 2025           Note:  This document was prepared using Dragon voice recognition software and may include unintentional dictation errors.    Joni Reining, PA-C 07/31/18 2028    Dionne Bucy, MD 07/31/18 [phone]

## 2018-07-31 NOTE — ED Notes (Signed)
Pt to sign waiver for pregnancy test; ct notified

## 2019-04-20 ENCOUNTER — Ambulatory Visit (INDEPENDENT_AMBULATORY_CARE_PROVIDER_SITE_OTHER): Payer: BC Managed Care – PPO | Admitting: Certified Nurse Midwife

## 2019-04-20 ENCOUNTER — Encounter: Payer: Self-pay | Admitting: Certified Nurse Midwife

## 2019-04-20 ENCOUNTER — Other Ambulatory Visit: Payer: Self-pay

## 2019-04-20 VITALS — BP 121/86 | HR 80 | Ht 67.0 in | Wt 214.0 lb

## 2019-04-20 DIAGNOSIS — Z3202 Encounter for pregnancy test, result negative: Secondary | ICD-10-CM | POA: Diagnosis not present

## 2019-04-20 DIAGNOSIS — Z30430 Encounter for insertion of intrauterine contraceptive device: Secondary | ICD-10-CM | POA: Diagnosis not present

## 2019-04-20 LAB — POCT URINE PREGNANCY: Preg Test, Ur: NEGATIVE

## 2019-04-20 NOTE — Progress Notes (Signed)
  GYNECOLOGY OFFICE PROCEDURE NOTE  Nicole Jarvis is a 38 y.o. [phone] here for mirena IUD insertion. No GYN concerns.  Last pap smear was on 04/14/2018 and was ASCUS/HPV negative  IUD Insertion Procedure Note Patient identified, informed consent performed, consent signed.   Discussed risks of irregular bleeding, cramping, infection, malpositioning or misplacement of the IUD outside the uterus which may require further procedure such as laparoscopy. Also discussed >99% contraception efficacy, increased risk of ectopic pregnancy with failure of method.  Time out was performed.  Urine pregnancy test negative.  Speculum placed in the vagina.  Cervix visualized.  Cleaned with Betadine x 2.  Grasped anteriorly with a single tooth tenaculum.  Uterus sounded to 8 cm.  Mirena IUD placed per manufacturer's recommendations.  Strings trimmed to 3 cm. Tenaculum was removed, good hemostasis noted.  Patient tolerated procedure well.   Patient was given post-procedure instructions.  She was advised to have backup contraception for one week.  Patient was also asked to check IUD strings periodically and follow up in 4-6 weeks forannual/ IUD check.   Philip Aspen, CNM

## 2019-04-20 NOTE — Patient Instructions (Signed)
Intrauterine Device Insertion, Care After  This sheet gives you information about how to care for yourself after your procedure. Your health care provider may also give you more specific instructions. If you have problems or questions, contact your health care provider. What can I expect after the procedure? After the procedure, it is common to have:  Cramps and pain in the abdomen.  Light bleeding (spotting) or heavier bleeding that is like your menstrual period. This may last for up to a few days.  Lower back pain.  Dizziness.  Headaches.  Nausea. Follow these instructions at home:  Before resuming sexual activity, check to make sure that you can feel the IUD string(s). You should be able to feel the end of the string(s) below the opening of your cervix. If your IUD string is in place, you may resume sexual activity. ? If you had a hormonal IUD inserted more than 7 days after your most recent period started, you will need to use a backup method of birth control for 7 days after IUD insertion. Ask your health care provider whether this applies to you.  Continue to check that the IUD is still in place by feeling for the string(s) after every menstrual period, or once a month.  Take over-the-counter and prescription medicines only as told by your health care provider.  Do not drive or use heavy machinery while taking prescription pain medicine.  Keep all follow-up visits as told by your health care provider. This is important. Contact a health care provider if:  You have bleeding that is heavier or lasts longer than a normal menstrual cycle.  You have a fever.  You have cramps or abdominal pain that get worse or do not get better with medicine.  You develop abdominal pain that is new or is not in the same area of earlier cramping and pain.  You feel lightheaded or weak.  You have abnormal or bad-smelling discharge from your vagina.  You have pain during sexual activity.   You have any of the following problems with your IUD string(s): ? The string bothers or hurts you or your sexual partner. ? You cannot feel the string. ? The string has gotten longer.  You can feel the IUD in your vagina.  You think you may be pregnant, or you miss your menstrual period.  You think you may have an STI (sexually transmitted infection). Get help right away if:  You have flu-like symptoms.  You have a fever and chills.  You can feel that your IUD has slipped out of place. Summary  After the procedure, it is common to have cramps and pain in the abdomen. It is also common to have light bleeding (spotting) or heavier bleeding that is like your menstrual period.  Continue to check that the IUD is still in place by feeling for the string(s) after every menstrual period, or once a month.  Keep all follow-up visits as told by your health care provider. This is important.  Contact your health care provider if you have problems with your IUD string(s), such as the string getting longer or bothering you or your sexual partner. This information is not intended to replace advice given to you by your health care provider. Make sure you discuss any questions you have with your health care provider. Document Released: 01/17/2011 Document Revised: 05/03/2017 Document Reviewed: 04/11/2016 Elsevier Patient Education  2020 [employer]  

## 2019-05-19 ENCOUNTER — Encounter: Payer: BC Managed Care – PPO | Admitting: Certified Nurse Midwife

## 2019-07-08 ENCOUNTER — Encounter (INDEPENDENT_AMBULATORY_CARE_PROVIDER_SITE_OTHER): Payer: Self-pay

## 2019-07-08 ENCOUNTER — Encounter (INDEPENDENT_AMBULATORY_CARE_PROVIDER_SITE_OTHER): Payer: Self-pay | Admitting: Nurse Practitioner

## 2019-07-09 ENCOUNTER — Other Ambulatory Visit (INDEPENDENT_AMBULATORY_CARE_PROVIDER_SITE_OTHER): Payer: Self-pay | Admitting: Nurse Practitioner

## 2019-07-09 DIAGNOSIS — I83819 Varicose veins of unspecified lower extremities with pain: Secondary | ICD-10-CM

## 2019-07-13 ENCOUNTER — Ambulatory Visit (INDEPENDENT_AMBULATORY_CARE_PROVIDER_SITE_OTHER): Payer: BC Managed Care – PPO | Admitting: Nurse Practitioner

## 2019-07-13 ENCOUNTER — Encounter (INDEPENDENT_AMBULATORY_CARE_PROVIDER_SITE_OTHER): Payer: Self-pay | Admitting: Nurse Practitioner

## 2019-07-13 ENCOUNTER — Ambulatory Visit (INDEPENDENT_AMBULATORY_CARE_PROVIDER_SITE_OTHER): Payer: BC Managed Care – PPO

## 2019-07-13 ENCOUNTER — Other Ambulatory Visit: Payer: Self-pay

## 2019-07-13 VITALS — BP 113/77 | HR 77 | Resp 12 | Ht 66.0 in | Wt 219.0 lb

## 2019-07-13 DIAGNOSIS — I83819 Varicose veins of unspecified lower extremities with pain: Secondary | ICD-10-CM

## 2019-07-13 DIAGNOSIS — I872 Venous insufficiency (chronic) (peripheral): Secondary | ICD-10-CM

## 2019-07-13 DIAGNOSIS — E6609 Other obesity due to excess calories: Secondary | ICD-10-CM | POA: Diagnosis not present

## 2019-07-13 DIAGNOSIS — Z6835 Body mass index (BMI) 35.0-35.9, adult: Secondary | ICD-10-CM

## 2019-07-14 ENCOUNTER — Encounter (INDEPENDENT_AMBULATORY_CARE_PROVIDER_SITE_OTHER): Payer: Self-pay | Admitting: Nurse Practitioner

## 2019-07-14 NOTE — Progress Notes (Signed)
SUBJECTIVE:  Patient ID: Nicole Jarvis, female    DOB: 10/15/1980, 39 y.o.   MRN: [id number] Chief Complaint  Patient presents with  . Follow-up    U/S Follow up     HPI  Nicole Jarvis is a 39 y.o. female that presents to our office as a new patient related to swelling and varicose veins.  The patient has struggled with swelling and varicose veins for well over a decade.  The patient originally sought treatment for varicose veins however due to the fact the patient was still currently bearing children and she was advised to not seek treatment until she was done having children.  Her youngest child is currently 40 years old.  In order to provide symptom relief the patient has worn medical grade 1 compression stockings on a daily basis for the last 10 years, in addition elevate her lower extremities and exercising daily.  When the patient was pregnant she will use compression stockings versus knee-high socks.  The patient's left leg has been much worse for years.  Her more prominent varicosities are on her left lower extremity.  She has also noticed the start of some mild stasis dermatitis in addition to some worsening of swelling in her bilateral lower extremities.  These varicosities are now becoming painful enough to where it is interfering with her activities of daily living such as grocery shopping, cleaning her home and being active with her children.  She denies any fever, chills, nausea, vomiting or diarrhea.  She does endorse a significant family history of varicose veins.  She denies any TIA-like symptoms thrombosis ejects.  No prior history of DVT.  Today patient underwent noninvasive studies.  The patient has evidence of reflux in the bilateral deep venous systems from the common femoral vein to the popliteal vein all greater than the second.  The patient also has evidence of venous reflux in the bilateral superficial venous systems from the great saphenous vein at the saphenofemoral  junction to the the proximal calf bilaterally.  The diameters on the right lower extremity range from 0.48 cm to 0.53.  The diameters on the left lower extremity range from 0.58 cm to 1.37 cm.  There is no evidence of DVT or superficial venous thrombosis bilaterally.    Past Medical History:  Diagnosis Date  . Rh negative, maternal     Past Surgical History:  Procedure Laterality Date  . GALLBLADDER SURGERY      Social History   Socioeconomic History  . Marital status: Married    Spouse name: Not on file  . Number of children: Not on file  . Years of education: Not on file  . Highest education level: Not on file  Occupational History  . Not on file  Tobacco Use  . Smoking status: Never Smoker  . Smokeless tobacco: Never Used  Substance and Sexual Activity  . Alcohol use: Yes    Comment: occas  . Drug use: No  . Sexual activity: Yes    Birth control/protection: I.U.D.  Other Topics Concern  . Not on file  Social History Narrative  . Not on file   Social Determinants of Health   Financial Resource Strain:   . Difficulty of Paying Living Expenses: Not on file  Food Insecurity:   . Worried About Programme researcher, broadcasting/film/video in the Last Year: Not on file  . Ran Out of Food in the Last Year: Not on file  Transportation Needs:   . Lack of  Transportation (Medical): Not on file  . Lack of Transportation (Non-Medical): Not on file  Physical Activity:   . Days of Exercise per Week: Not on file  . Minutes of Exercise per Session: Not on file  Stress:   . Feeling of Stress : Not on file  Social Connections:   . Frequency of Communication with Friends and Family: Not on file  . Frequency of Social Gatherings with Friends and Family: Not on file  . Attends Religious Services: Not on file  . Active Member of Clubs or Organizations: Not on file  . Attends Archivist Meetings: Not on file  . Marital Status: Not on file  Intimate Partner Violence:   . Fear of Current or  Ex-Partner: Not on file  . Emotionally Abused: Not on file  . Physically Abused: Not on file  . Sexually Abused: Not on file    Family History  Problem Relation Age of Onset  . Colon cancer Maternal Grandmother   . Breast cancer Neg Hx   . Ovarian cancer Neg Hx   . Diabetes Neg Hx   . Heart disease Neg Hx     No Known Allergies   Review of Systems   Review of Systems: Negative Unless Checked Constitutional: [] Weight loss  [] Fever  [] Chills Cardiac: [] Chest pain   []  Atrial Fibrillation  [] Palpitations   [] Shortness of breath when laying flat   [] Shortness of breath with exertion. [] Shortness of breath at rest Vascular:  [] Pain in legs with walking   [] Pain in legs with standing [] Pain in legs when laying flat   [] Claudication    [] Pain in feet when laying flat    [] History of DVT   [] Phlebitis   [x] Swelling in legs   [x] Varicose veins   [] Non-healing ulcers Pulmonary:   [] Uses home oxygen   [] Productive cough   [] Hemoptysis   [] Wheeze  [] COPD   [x] Asthma Neurologic:  [] Dizziness   [] Seizures  [] Blackouts [] History of stroke   [] History of TIA  [] Aphasia   [] Temporary Blindness   [] Weakness or numbness in arm   [] Weakness or numbness in leg Musculoskeletal:   [] Joint swelling   [] Joint pain   [] Low back pain  []  History of Knee Replacement [] Arthritis [] back Surgeries  []  Spinal Stenosis    Hematologic:  [] Easy bruising  [] Easy bleeding   [] Hypercoagulable state   [] Anemic Gastrointestinal:  [] Diarrhea   [] Vomiting  [] Gastroesophageal reflux/heartburn   [] Difficulty swallowing. [] Abdominal pain Genitourinary:  [] Chronic kidney disease   [] Difficult urination  [] Anuric   [] Blood in urine [] Frequent urination  [] Burning with urination   [] Hematuria Skin:  [x] Rashes   [] Ulcers [] Wounds Psychological:  [] History of anxiety   []  History of major depression  []  Memory Difficulties      OBJECTIVE:   Physical Exam  BP 113/77   Pulse 77   Resp 12   Ht 5\' 6"  (1.676 m)   Wt 219 lb (99.3  kg)   BMI 35.35 kg/m   Gen: WD/WN, NAD Head: [place]/AT, No temporalis wasting.  Ear/Nose/Throat: Hearing grossly intact, nares w/o erythema or drainage Eyes: PER, EOMI, sclera nonicteric.  Neck: Supple, no masses.  No JVD.  Pulmonary:  Good air movement, no use of accessory muscles.  Cardiac: RRR Vascular:  Scattered spider varicosities bilaterally.  Prominent varicosities on the left lower extremity measuring 5 to 8 mm Vessel Right Left  Radial Palpable Palpable  Dorsalis Pedis Palpable Palpable  Posterior Tibial Palpable Palpable   Gastrointestinal: soft, non-distended.  No guarding/no peritoneal signs.  Musculoskeletal: M/S 5/5 throughout.  No deformity or atrophy.  Neurologic: Pain and light touch intact in extremities.  Symmetrical.  Speech is fluent. Motor exam as listed above. Psychiatric: Judgment intact, Mood & affect appropriate for pt's clinical situation. Dermatologic:  Mild stasis dermatitis bilaterally. No Ulcers Noted.  No changes consistent with cellulitis. Lymph : No Cervical lymphadenopathy, no lichenification or early dermal thickening      ASSESSMENT AND PLAN:  1. Varicose veins with pain Recommend  I have reviewed my previous  discussion with the patient regarding  varicose veins and why they cause symptoms. Patient will continue  wearing graduated compression stockings class 1 on a daily basis, beginning first thing in the morning and removing them in the evening.    In addition, behavioral modification including elevation during the day was again discussed and this will continue.  The patient has utilized over the counter pain medications and has been exercising.  However, at this time conservative therapy has not alleviated the patient's symptoms of leg pain and swelling  Recommend: laser ablation of the right and  left great saphenous veins to eliminate the symptoms of pain and swelling of the lower extremities caused by the severe superficial venous reflux  disease.  Left should be done first.  2. Class 2 obesity due to excess calories with body mass index (BMI) of 35.0 to 35.9 in adult, unspecified whether serious comorbidity present Obesity is a contributing factor to significant chronic venous insufficiency.  Chronic venous insufficiency can lead to worsening condition such as lymphedema and possible venous ulcerations.  Exercise and activities to assist with weight loss are encouraged today.  Patient is currently seeing a bariatric surgery specialist, which would certainly help with weight loss.  3. Chronic venous insufficiency Patient will continue with conservative therapy as outlined above including utilization of medical 1 Compression stockings, elevation and exercise.    Current Outpatient Medications on File Prior to Visit  Medication Sig Dispense Refill  . albuterol (PROVENTIL) (2.5 MG/3ML) 0.083% nebulizer solution Take 2.5 mg by nebulization every 6 (six) hours as needed for wheezing or shortness of breath.     No current facility-administered medications on file prior to visit.    There are no Patient Instructions on file for this visit. No follow-ups on file.   Georgiana Spinner, NP  This note was completed with Office manager.  Any errors are purely unintentional.

## 2019-07-24 ENCOUNTER — Encounter: Payer: BC Managed Care – PPO | Admitting: Certified Nurse Midwife

## 2019-07-24 ENCOUNTER — Encounter (INDEPENDENT_AMBULATORY_CARE_PROVIDER_SITE_OTHER): Payer: Self-pay

## 2019-07-29 ENCOUNTER — Encounter: Payer: BC Managed Care – PPO | Admitting: Certified Nurse Midwife

## 2019-08-01 ENCOUNTER — Ambulatory Visit: Payer: BC Managed Care – PPO | Attending: Internal Medicine

## 2019-08-01 ENCOUNTER — Other Ambulatory Visit: Payer: Self-pay

## 2019-08-01 DIAGNOSIS — Z23 Encounter for immunization: Secondary | ICD-10-CM | POA: Insufficient documentation

## 2019-08-01 NOTE — Progress Notes (Signed)
   Covid-19 Vaccination Clinic  Name:  Nicole Jarvis    MRN: [id number] DOB: 17-Jun-1980  08/01/2019  Nicole Jarvis was observed post Covid-19 immunization for 15 minutes without incidence. She was provided with Vaccine Information Sheet and instruction to access the V-Safe system.   Nicole Jarvis was instructed to call 911 with any severe reactions post vaccine: Marland Kitchen Difficulty breathing  . Swelling of your face and throat  . A fast heartbeat  . A bad rash all over your body  . Dizziness and weakness    Immunizations Administered    Name Date Dose VIS Date Route   Moderna COVID-19 Vaccine 08/01/2019  4:16 PM 0.5 mL 05/05/2019 Intramuscular   Manufacturer: Moderna   Lot: [id number]   NDC: 92010-071-21

## 2019-08-29 ENCOUNTER — Ambulatory Visit: Payer: BC Managed Care – PPO

## 2019-08-29 ENCOUNTER — Ambulatory Visit: Payer: BC Managed Care – PPO | Attending: Internal Medicine

## 2019-08-29 DIAGNOSIS — Z23 Encounter for immunization: Secondary | ICD-10-CM

## 2019-08-29 NOTE — Progress Notes (Signed)
   Covid-19 Vaccination Clinic  Name:  ELICIA LUI    MRN: [id number] DOB: 1981/04/28  08/29/2019  Ms. Schrecengost was observed post Covid-19 immunization for 15 minutes without incident. She was provided with Vaccine Information Sheet and instruction to access the V-Safe system.   Ms. Conway was instructed to call 911 with any severe reactions post vaccine: Marland Kitchen Difficulty breathing  . Swelling of face and throat  . A fast heartbeat  . A bad rash all over body  . Dizziness and weakness   Immunizations Administered    Name Date Dose VIS Date Route   Moderna COVID-19 Vaccine 08/29/2019  2:33 PM 0.5 mL 05/05/2019 Intramuscular   Manufacturer: Gala Murdoch   Lot: [id number]   NDC: 29980-699-96

## 2019-09-07 ENCOUNTER — Telehealth (INDEPENDENT_AMBULATORY_CARE_PROVIDER_SITE_OTHER): Payer: Self-pay

## 2019-09-07 NOTE — Telephone Encounter (Signed)
The pt called and said she is scheduled to have a laser procedure on 4/15 and also in May  and was told that she will have to tale blood thinners to have these procedures. The pt would like to know the duration that she will have to be on the blood thinners due to her having a procedure scheduled in June. Please advise.

## 2019-09-07 NOTE — Telephone Encounter (Signed)
Spoke to DR. Schnier and  I was advised to make pt aware that she  Does not need to stop any medicine and that for varicose vein procedure we put pt's on Ib profen . The pt was called and made aware of the above.

## 2019-09-08 ENCOUNTER — Other Ambulatory Visit (HOSPITAL_COMMUNITY)
Admission: RE | Admit: 2019-09-08 | Discharge: 2019-09-08 | Disposition: A | Discharge status: Home / Self Care | Payer: BC Managed Care – PPO | Source: Ambulatory Visit | Attending: Certified Nurse Midwife | Admitting: Certified Nurse Midwife

## 2019-09-08 ENCOUNTER — Ambulatory Visit (INDEPENDENT_AMBULATORY_CARE_PROVIDER_SITE_OTHER): Payer: BC Managed Care – PPO | Admitting: Certified Nurse Midwife

## 2019-09-08 ENCOUNTER — Encounter: Payer: Self-pay | Admitting: Certified Nurse Midwife

## 2019-09-08 ENCOUNTER — Other Ambulatory Visit: Payer: Self-pay

## 2019-09-08 VITALS — BP 106/79 | HR 74 | Ht 66.0 in | Wt 220.3 lb

## 2019-09-08 DIAGNOSIS — Z23 Encounter for immunization: Secondary | ICD-10-CM | POA: Diagnosis not present

## 2019-09-08 DIAGNOSIS — Z124 Encounter for screening for malignant neoplasm of cervix: Secondary | ICD-10-CM

## 2019-09-08 DIAGNOSIS — Z01419 Encounter for gynecological examination (general) (routine) without abnormal findings: Secondary | ICD-10-CM | POA: Insufficient documentation

## 2019-09-08 MED ORDER — TETANUS-DIPHTH-ACELL PERTUSSIS 5-2.5-18.5 LF-MCG/0.5 IM SUSP
0.5000 mL | Freq: Once | INTRAMUSCULAR | Status: AC
  Administered 2019-09-08: 10:00:00 0.5 mL via INTRAMUSCULAR

## 2019-09-08 NOTE — Patient Instructions (Signed)
Preventive Care 21-37 Years Old, Female Preventive care refers to visits with your health care provider and lifestyle choices that can promote health and wellness. This includes:  A yearly physical exam. This may also be called an annual well check.  Regular dental visits and eye exams.  Immunizations.  Screening for certain conditions.  Healthy lifestyle choices, such as eating a healthy diet, getting regular exercise, not using drugs or products that contain nicotine and tobacco, and limiting alcohol use. What can I expect for my preventive care visit? Physical exam Your health care provider will check your:  Height and weight. This may be used to calculate body mass index (BMI), which tells if you are at a healthy weight.  Heart rate and blood pressure.  Skin for abnormal spots. Counseling Your health care provider may ask you questions about your:  Alcohol, tobacco, and drug use.  Emotional well-being.  Home and relationship well-being.  Sexual activity.  Eating habits.  Work and work environment.  Method of birth control.  Menstrual cycle.  Pregnancy history. What immunizations do I need?  Influenza (flu) vaccine  This is recommended every year. Tetanus, diphtheria, and pertussis (Tdap) vaccine  You may need a Td booster every 10 years. Varicella (chickenpox) vaccine  You may need this if you have not been vaccinated. Human papillomavirus (HPV) vaccine  If recommended by your health care provider, you may need three doses over 6 months. Measles, mumps, and rubella (MMR) vaccine  You may need at least one dose of MMR. You may also need a second dose. Meningococcal conjugate (MenACWY) vaccine  One dose is recommended if you are age 19-21 years and a first-year college student living in a residence hall, or if you have one of several medical conditions. You may also need additional booster doses. Pneumococcal conjugate (PCV13) vaccine  You may need  this if you have certain conditions and were not previously vaccinated. Pneumococcal polysaccharide (PPSV23) vaccine  You may need one or two doses if you smoke cigarettes or if you have certain conditions. Hepatitis A vaccine  You may need this if you have certain conditions or if you travel or work in places where you may be exposed to hepatitis A. Hepatitis B vaccine  You may need this if you have certain conditions or if you travel or work in places where you may be exposed to hepatitis B. Haemophilus influenzae type b (Hib) vaccine  You may need this if you have certain conditions. You may receive vaccines as individual doses or as more than one vaccine together in one shot (combination vaccines). Talk with your health care provider about the risks and benefits of combination vaccines. What tests do I need?  Blood tests  Lipid and cholesterol levels. These may be checked every 5 years starting at age 20.  Hepatitis C test.  Hepatitis B test. Screening  Diabetes screening. This is done by checking your blood sugar (glucose) after you have not eaten for a while (fasting).  Sexually transmitted disease (STD) testing.  BRCA-related cancer screening. This may be done if you have a family history of breast, ovarian, tubal, or peritoneal cancers.  Pelvic exam and Pap test. This may be done every 3 years starting at age 21. Starting at age 30, this may be done every 5 years if you have a Pap test in combination with an HPV test. Talk with your health care provider about your test results, treatment options, and if necessary, the need for more tests.   Follow these instructions at home: Eating and drinking   Eat a diet that includes fresh fruits and vegetables, whole grains, lean protein, and low-fat dairy.  Take vitamin and mineral supplements as recommended by your health care provider.  Do not drink alcohol if: ? Your health care provider tells you not to drink. ? You are  pregnant, may be pregnant, or are planning to become pregnant.  If you drink alcohol: ? Limit how much you have to 0-1 drink a day. ? Be aware of how much alcohol is in your drink. In the [place] one drink equals one 12 oz bottle of beer (355 mL), one 5 oz glass of wine (148 mL), or one 1 oz glass of hard liquor (44 mL). Lifestyle  Take daily care of your teeth and gums.  Stay active. Exercise for at least 30 minutes on 5 or more days each week.  Do not use any products that contain nicotine or tobacco, such as cigarettes, e-cigarettes, and chewing tobacco. If you need help quitting, ask your health care provider.  If you are sexually active, practice safe sex. Use a condom or other form of birth control (contraception) in order to prevent pregnancy and STIs (sexually transmitted infections). If you plan to become pregnant, see your health care provider for a preconception visit. What's next?  Visit your health care provider once a year for a well check visit.  Ask your health care provider how often you should have your eyes and teeth checked.  Stay up to date on all vaccines. This information is not intended to replace advice given to you by your health care provider. Make sure you discuss any questions you have with your health care provider. Document Revised: 01/30/2018 Document Reviewed: 01/30/2018 Elsevier Patient Education  2020 [employer].

## 2019-09-08 NOTE — Progress Notes (Signed)
GYNECOLOGY ANNUAL PREVENTATIVE CARE ENCOUNTER NOTE  History:     Nicole Jarvis is a 39 y.o. [phone] female here for a routine annual gynecologic exam.  Current complaints: none  Is having surgery on her veins and gastric sleeve  this year. Has history of ovarian cysts and concerns about cancer. Denies abnormal vaginal bleeding, discharge, pelvic pain, problems with intercourse or other gynecologic concerns.     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 (Menstrual status: IUD). Contraception: IUD (04/2019) Last Pap: 04/14/2018 Results were: abnormal with negative HPV Last mammogram: n/a.   Obstetric History OB History  Gravida Para Term Preterm AB Living  4 2 2   2 2   SAB TAB Ectopic Multiple Live Births  2       2    # Outcome Date GA Lbr Len/2nd Weight Sex Delivery Anes PTL Lv  4 Term 2013   8 lb 12.8 oz (3.992 kg) M Vag-Spont   LIV  3 SAB 2012          2 Term 2006   5 lb 6.4 oz (2.449 kg) M Vag-Spont   LIV     Complications: Pregnancy induced hypertension  1 SAB 2005            Past Medical History:  Diagnosis Date  . Rh negative, maternal     Past Surgical History:  Procedure Laterality Date  . GALLBLADDER SURGERY      Current Outpatient Medications on File Prior to Visit  Medication Sig Dispense Refill  . albuterol (PROVENTIL) (2.5 MG/3ML) 0.083% nebulizer solution Take 2.5 mg by nebulization every 6 (six) hours as needed for wheezing or shortness of breath.     No current facility-administered medications on file prior to visit.    No Known Allergies  Social History:  reports that she has never smoked. She has never used smokeless tobacco. She reports current alcohol use. She reports that she does not use drugs.  Family History  Problem  Relation Age of Onset  . Colon cancer Maternal Grandmother   . Breast cancer Neg Hx   . Ovarian cancer Neg Hx   . Diabetes Neg Hx   . Heart disease Neg Hx     The following portions of the patient's history were reviewed and updated as appropriate: allergies, current medications, past family history, past medical history, past social history, past surgical history and problem list.  Review of Systems Pertinent items noted in HPI and remainder of comprehensive ROS otherwise negative.  Physical Exam:  There were no vitals taken for this visit. CONSTITUTIONAL: Well-developed, well-nourished, obese female in no acute distress.  HENT:  Normocephalic, atraumatic, External right and left ear normal. Oropharynx is clear and moist EYES: Conjunctivae and EOM are normal. Pupils are equal, round, and reactive to light. No scleral icterus.  NECK: Normal range of motion, supple, no masses.  Normal thyroid.  SKIN: Skin is warm and dry. No rash noted. Not diaphoretic. No erythema. No pallor. MUSCULOSKELETAL: Normal range of motion. No tenderness.  No cyanosis, clubbing, or edema.  2+ distal pulses. NEUROLOGIC: Alert and oriented to person, place, and time. Normal reflexes, muscle tone coordination.  PSYCHIATRIC: Normal mood and affect. Normal behavior. Normal judgment and thought content. CARDIOVASCULAR: Normal heart rate noted, regular  rhythm RESPIRATORY: Clear to auscultation bilaterally. Effort and breath sounds normal, no problems with respiration noted. BREASTS: Symmetric in size. No masses, tenderness, skin changes, nipple drainage, or lymphadenopathy bilaterally. Performed in the presence of a chaperone. ABDOMEN: Soft, no distention noted.  No tenderness, rebound or guarding.  PELVIC: Normal appearing external genitalia and urethral meatus; normal appearing vaginal mucosa and cervix.  No abnormal discharge noted.  Pap smear obtained.  Normal uterine size, no other palpable masses, no uterine or  adnexal tenderness.  Performed in the presence of a chaperone.   Assessment and Plan:    1. Women's annual routine gynecological examination  Will follow up results of pap smear and manage accordingly. Mammogram not indicated Discussed ovarian cysts as normal occurences due to ovulatory function. She denies any pelvic pain. Pt reassured and instructed to let me know if she has pain then can do u/s. She verbalizes and agrees to plan.  Routine preventative health maintenance measures emphasized. Please refer to After Visit Summary for other counseling recommendations.      Philip Aspen, CNM

## 2019-09-08 NOTE — Addendum Note (Signed)
Addended by: Brooke Dare on: 09/08/2019 09:49 AM   Modules accepted: Orders

## 2019-09-14 LAB — CYTOLOGY - PAP
Comment: NEGATIVE
Diagnosis: UNDETERMINED — AB
High risk HPV: NEGATIVE

## 2019-09-17 ENCOUNTER — Encounter (INDEPENDENT_AMBULATORY_CARE_PROVIDER_SITE_OTHER): Payer: Self-pay | Admitting: Vascular Surgery

## 2019-09-17 ENCOUNTER — Other Ambulatory Visit: Payer: Self-pay

## 2019-09-17 ENCOUNTER — Ambulatory Visit (INDEPENDENT_AMBULATORY_CARE_PROVIDER_SITE_OTHER): Payer: BC Managed Care – PPO | Admitting: Vascular Surgery

## 2019-09-17 DIAGNOSIS — I83812 Varicose veins of left lower extremities with pain: Secondary | ICD-10-CM | POA: Diagnosis not present

## 2019-09-17 DIAGNOSIS — I83819 Varicose veins of unspecified lower extremities with pain: Secondary | ICD-10-CM

## 2019-09-21 ENCOUNTER — Other Ambulatory Visit (INDEPENDENT_AMBULATORY_CARE_PROVIDER_SITE_OTHER): Payer: Self-pay | Admitting: Vascular Surgery

## 2019-09-21 ENCOUNTER — Ambulatory Visit (INDEPENDENT_AMBULATORY_CARE_PROVIDER_SITE_OTHER): Payer: BC Managed Care – PPO

## 2019-09-21 ENCOUNTER — Other Ambulatory Visit: Payer: Self-pay

## 2019-09-21 DIAGNOSIS — I83819 Varicose veins of unspecified lower extremities with pain: Secondary | ICD-10-CM

## 2019-09-23 ENCOUNTER — Encounter (INDEPENDENT_AMBULATORY_CARE_PROVIDER_SITE_OTHER): Payer: Self-pay | Admitting: Vascular Surgery

## 2019-09-23 DIAGNOSIS — I83819 Varicose veins of unspecified lower extremities with pain: Secondary | ICD-10-CM | POA: Insufficient documentation

## 2019-09-23 NOTE — Progress Notes (Signed)
    MRN : [id number]  Nicole Jarvis is a 39 y.o. (1981-03-20) female who presents with chief complaint of  Chief Complaint  Patient presents with  . Follow-up    L GSV Laser  .    The patient's left lower extremity was sterilely prepped and draped.  The ultrasound machine was used to visualize the left great saphenous vein throughout its course.  A segment below the knee was selected for access.  The saphenous vein was accessed without difficulty using ultrasound guidance with a micropuncture needle.   An 0.018  wire was placed beyond the saphenofemoral junction through the sheath and the microneedle was removed.  The 65 cm sheath was then placed over the wire and the wire and dilator were removed.  The laser fiber was placed through the sheath and its tip was placed approximately 2 cm below the saphenofemoral junction.  Tumescent anesthesia was then created with a dilute lidocaine solution.  Laser energy was then delivered with constant withdrawal of the sheath and laser fiber.  Approximately 2363 Joules of energy were delivered over a length of 46 cm.  Sterile dressings were placed.  The patient tolerated the procedure well without complications.

## 2019-10-01 ENCOUNTER — Telehealth (INDEPENDENT_AMBULATORY_CARE_PROVIDER_SITE_OTHER): Payer: Self-pay

## 2019-10-01 NOTE — Telephone Encounter (Signed)
Patient has been made aware with medical advice and verbalized understanding. I informed that patient a Rx for Tramadol 50mg  1 tablet every 6hours prn #20 with no refills will be called into pharmacy.

## 2019-10-01 NOTE — Telephone Encounter (Signed)
That is normal because the area is inflamed.  Although he did the ablation, she should still be wearing compression to help.  If the ibuprofen is not helping we can try a little tramadol for the patient if she would like

## 2019-10-14 NOTE — Progress Notes (Signed)
    MRN : [id number]  Nicole Jarvis is a 39 y.o. (January 01, 1981) female who presents with chief complaint of No chief complaint on file. .    The patient's right lower extremity was sterilely prepped and draped.  The ultrasound machine was used to visualize the right great saphenous vein throughout its course.  A segment belowq the knee was selected for access.  The saphenous vein was accessed without difficulty using ultrasound guidance with a micropuncture needle.   An 0.018  wire was placed beyond the saphenofemoral junction through the sheath and the microneedle was removed.  The 65 cm sheath was then placed over the wire and the wire and dilator were removed.  The laser fiber was placed through the sheath and its tip was placed approximately 2 cm below the saphenofemoral junction.  Tumescent anesthesia was then created with a dilute lidocaine solution.  Laser energy was then delivered with constant withdrawal of the sheath and laser fiber.  Approximately 1976 Joules of energy were delivered over a length of 43 cm.  Sterile dressings were placed.  The patient tolerated the procedure well without complications.

## 2019-10-15 ENCOUNTER — Ambulatory Visit (INDEPENDENT_AMBULATORY_CARE_PROVIDER_SITE_OTHER): Payer: BC Managed Care – PPO | Admitting: Vascular Surgery

## 2019-10-15 ENCOUNTER — Other Ambulatory Visit: Payer: Self-pay

## 2019-10-15 ENCOUNTER — Encounter (INDEPENDENT_AMBULATORY_CARE_PROVIDER_SITE_OTHER): Payer: Self-pay | Admitting: Vascular Surgery

## 2019-10-15 VITALS — BP 128/84 | HR 80 | Ht 66.0 in | Wt 224.0 lb

## 2019-10-15 DIAGNOSIS — I83811 Varicose veins of right lower extremities with pain: Secondary | ICD-10-CM

## 2019-10-15 DIAGNOSIS — I83819 Varicose veins of unspecified lower extremities with pain: Secondary | ICD-10-CM

## 2019-10-19 ENCOUNTER — Other Ambulatory Visit: Payer: Self-pay

## 2019-10-19 ENCOUNTER — Other Ambulatory Visit (INDEPENDENT_AMBULATORY_CARE_PROVIDER_SITE_OTHER): Payer: Self-pay | Admitting: Vascular Surgery

## 2019-10-19 ENCOUNTER — Ambulatory Visit (INDEPENDENT_AMBULATORY_CARE_PROVIDER_SITE_OTHER): Payer: BC Managed Care – PPO

## 2019-10-19 DIAGNOSIS — I83811 Varicose veins of right lower extremities with pain: Secondary | ICD-10-CM

## 2019-10-26 ENCOUNTER — Encounter (INDEPENDENT_AMBULATORY_CARE_PROVIDER_SITE_OTHER): Payer: Self-pay | Admitting: Vascular Surgery

## 2021-02-09 IMAGING — CT CT RENAL STONE PROTOCOL
2 of 4 series · 16 of 46 positions shown, 18 images · non-contrast
Comparison: None.

CLINICAL DATA: PT c/o RT flank pain xfew days with urinary
frequency. PT also states intermit sharp pain through lower abd.
Denies n/v/d . No hx of kidney stones.

EXAM:
CT ABDOMEN AND PELVIS WITHOUT CONTRAST
TECHNIQUE: Multidetector CT imaging of the abdomen and pelvis was performed
following the standard protocol without IV contrast.

[Series 2: stone full standard · axial · 0.70mm/px · z∈[-477,-37]mm · 13 of 96 slices shown, 15 images]
[im 4/96  soft-tissue]
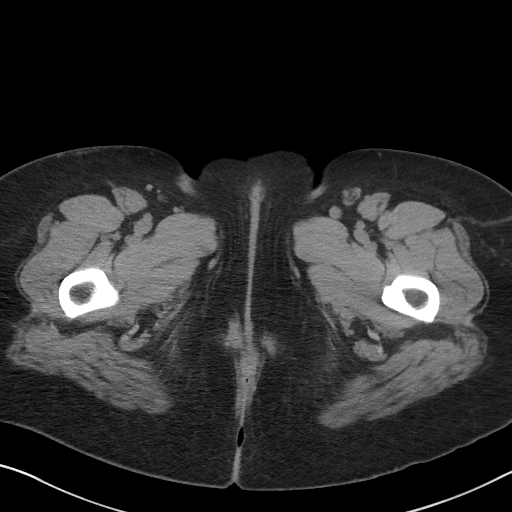
[im 4/96  bone]
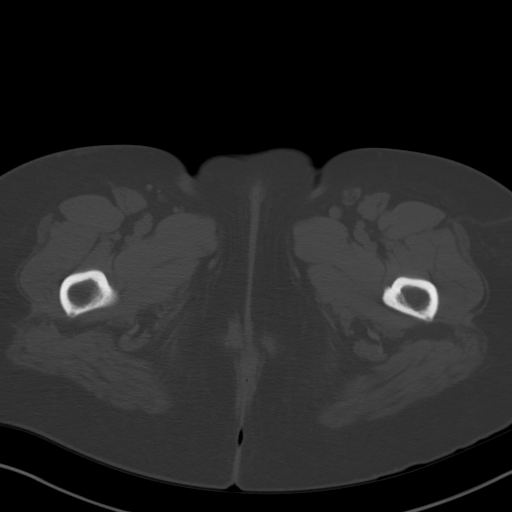
[im 12/96  soft-tissue]
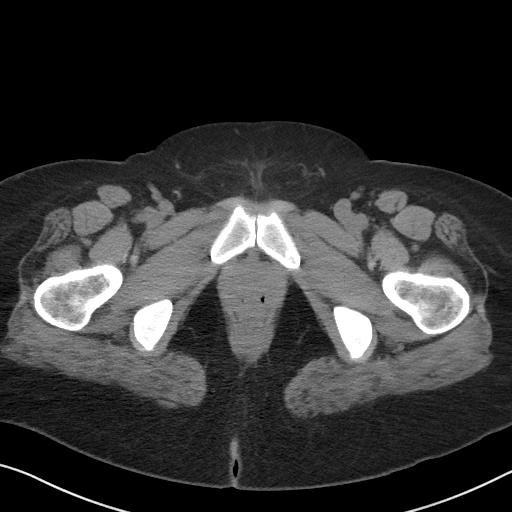
[im 20/96  soft-tissue]
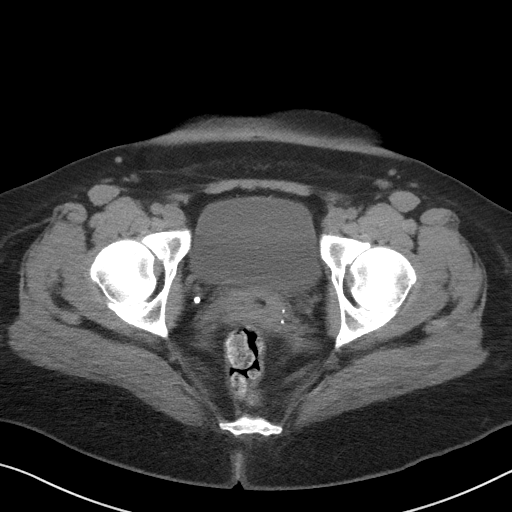
[im 28/96  soft-tissue]
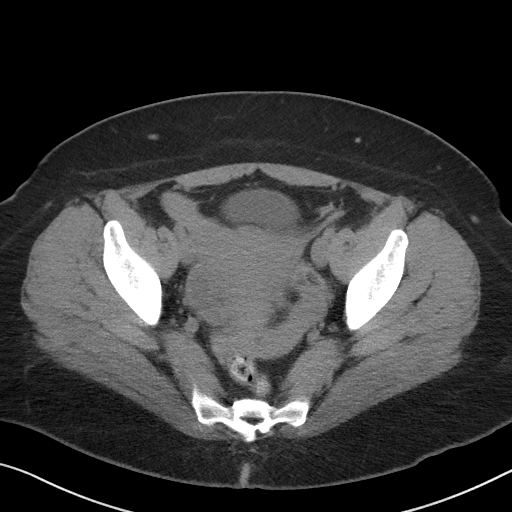
[im 32/96  soft-tissue]
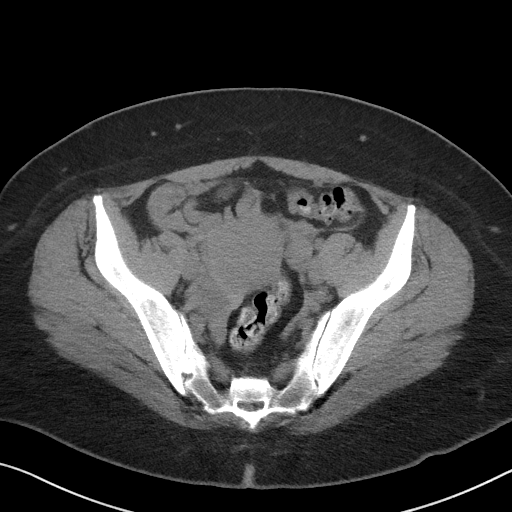
[im 40/96  soft-tissue]
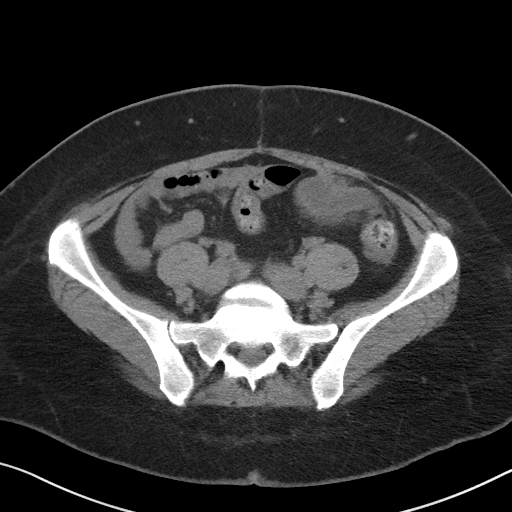
[im 48/96  soft-tissue]
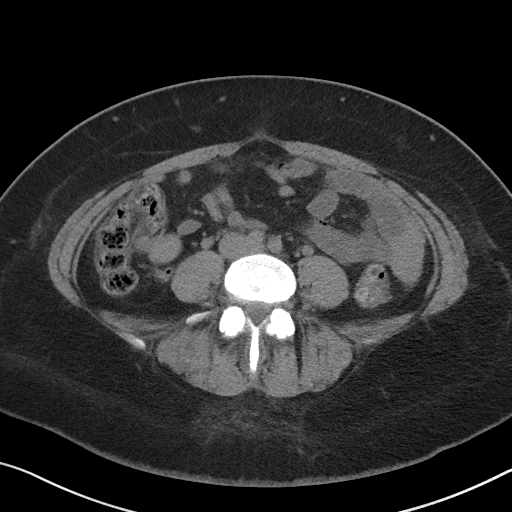
[im 56/96  soft-tissue]
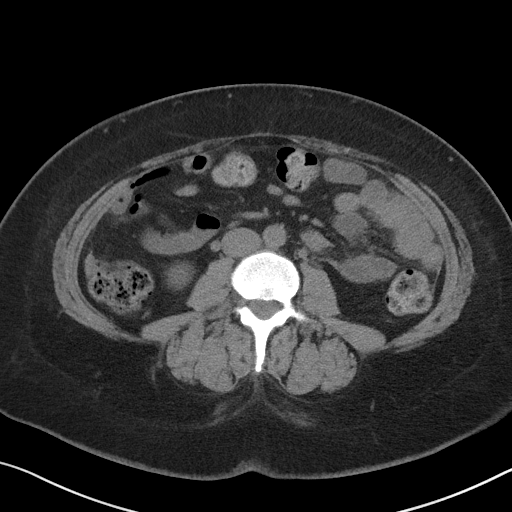
[im 64/96  soft-tissue]
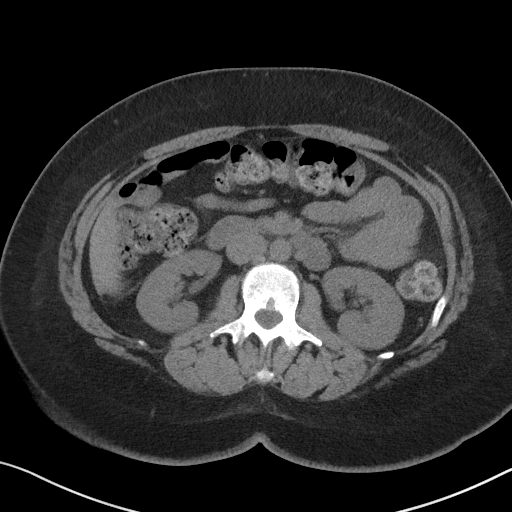
[im 64/96  bone]
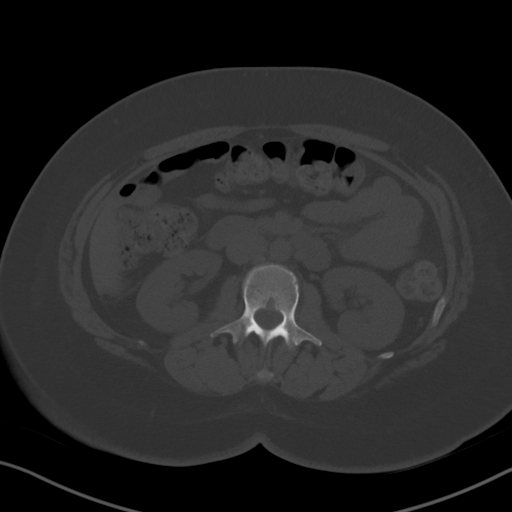
[im 68/96  soft-tissue]
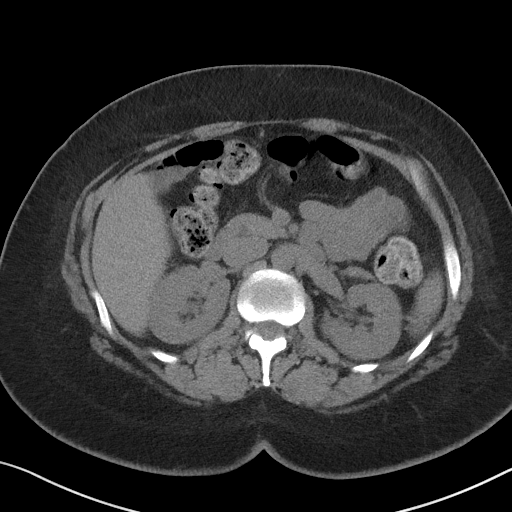
[im 76/96  soft-tissue]
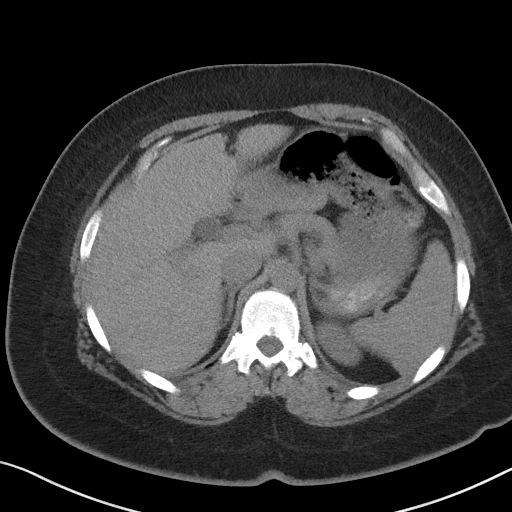
[im 84/96  soft-tissue]
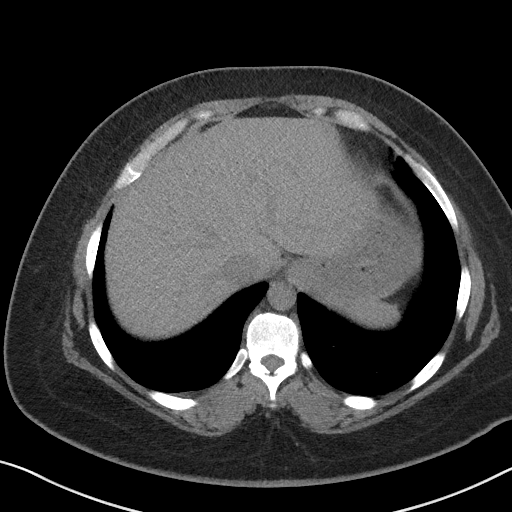
[im 92/96  soft-tissue]
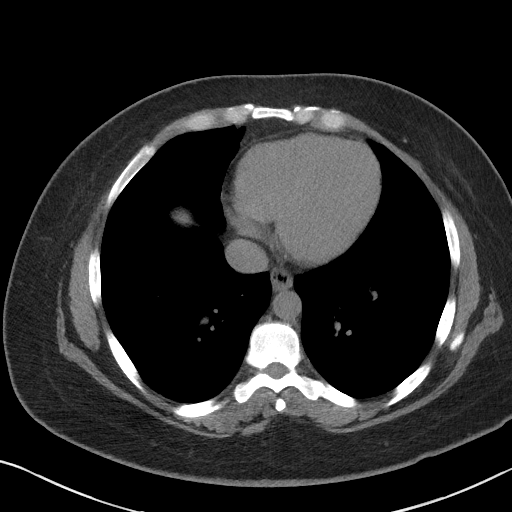

[Series 5: coronal · coronal · 0.79mm/px · 3 of 132 slices shown]
[im 44/132  soft-tissue]
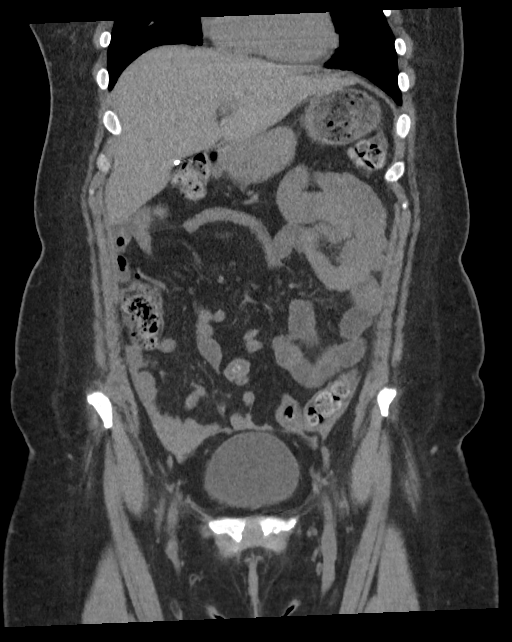
[im 59/132  soft-tissue]
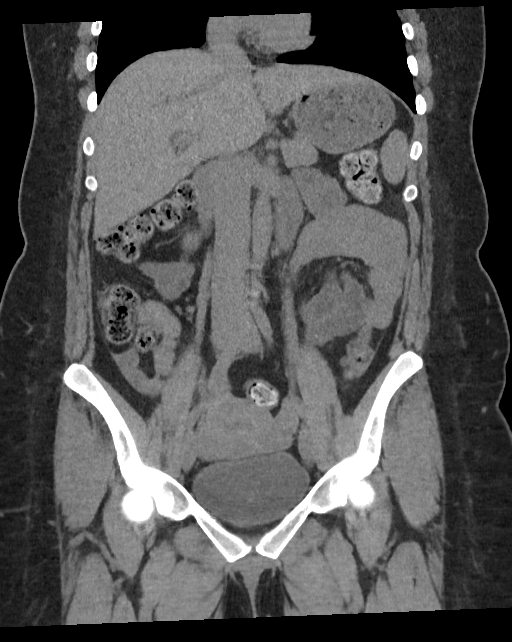
[im 73/132  soft-tissue]
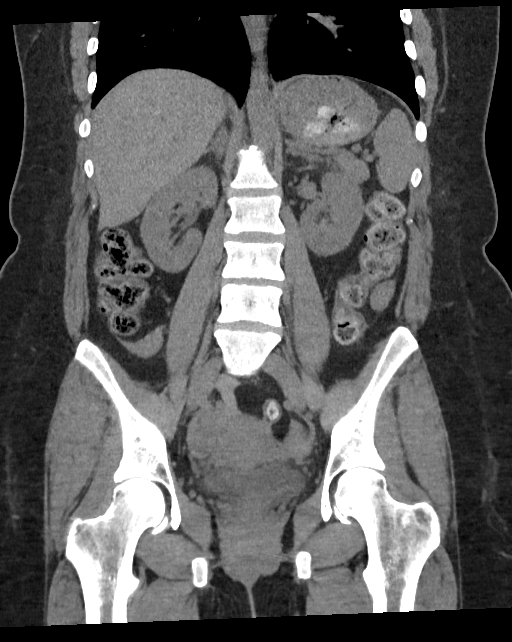

[16 of 46 positions shown; findings below may reference images not displayed]

FINDINGS: Lower chest: Clear lung bases.  Heart normal in size.

Hepatobiliary: Normal liver. Status post cholecystectomy. Dilation
of the common bile duct, maximum 9 mm, with distal tapering.

Pancreas: Unremarkable. No pancreatic ductal dilatation or
surrounding inflammatory changes.

Spleen: Normal in size without focal abnormality.

Adrenals/Urinary Tract: Adrenal glands are unremarkable. Kidneys are
normal, without renal calculi, focal lesion, or hydronephrosis.
Bladder is unremarkable.

Stomach/Bowel: Stomach is unremarkable. Small bowel and colon are
normal in caliber. No wall thickening or inflammation. Mild
increased stool burden noted throughout the colon. Normal appendix
visualized.

Vascular/Lymphatic: No significant vascular findings are present. No
enlarged abdominal or pelvic lymph nodes.

Reproductive: Uterus and bilateral adnexa are unremarkable. There is
a small amount of pelvic free fluid, mostly in the cul-de-sac, with
Hounsfield units averaging 40.

Other: No hernia.

Musculoskeletal: No fracture or acute finding. No osteoblastic or
osteolytic lesions.
IMPRESSION: 1. Small amount of relatively high attenuation pelvic free fluid,
suggesting hemorrhage. This may be from a ruptured ovarian cyst.
2. No other evidence of an acute abnormality. No renal or ureteral
stones or obstructive uropathy. No other findings to account for the
patient's symptoms.
3. Status post cholecystectomy. Mild chronic dilation of common bile
duct.

## 2022-04-02 ENCOUNTER — Encounter (INDEPENDENT_AMBULATORY_CARE_PROVIDER_SITE_OTHER): Payer: Self-pay

## 2022-04-24 ENCOUNTER — Telehealth: Payer: Self-pay | Admitting: Family Medicine

## 2022-04-24 DIAGNOSIS — J069 Acute upper respiratory infection, unspecified: Secondary | ICD-10-CM

## 2022-04-24 MED ORDER — ALBUTEROL SULFATE HFA 108 (90 BASE) MCG/ACT IN AERS: 2.0000 | INHALATION_SPRAY | Freq: Four times a day (QID) | RESPIRATORY_TRACT | 0 refills | Status: DC | PRN

## 2022-04-24 MED ORDER — AZELASTINE HCL 0.1 % NA SOLN: 1.0000 | Freq: Two times a day (BID) | NASAL | 0 refills | Status: DC

## 2022-04-24 MED ORDER — BENZONATATE 100 MG PO CAPS: 100.0000 mg | ORAL_CAPSULE | Freq: Two times a day (BID) | ORAL | 0 refills | Status: DC | PRN

## 2022-04-24 MED ORDER — PROMETHAZINE-DM 6.25-15 MG/5ML PO SYRP: 5.0000 mL | ORAL_SOLUTION | Freq: Four times a day (QID) | ORAL | 0 refills | Status: DC | PRN

## 2022-04-24 NOTE — Progress Notes (Signed)
E-Visit for Upper Respiratory Infection   We are sorry you are not feeling well.  Here is how we plan to help!  Based on what you have shared with me, it looks like you may have a viral upper respiratory infection.  Upper respiratory infections are caused by a large number of viruses; however, rhinovirus is the most common cause.   Symptoms vary from person to person, with common symptoms including sore throat, cough, fatigue or lack of energy and feeling of general discomfort.  A low-grade fever of up to 100.4 may present, but is often uncommon.  Symptoms vary however, and are closely related to a person's age or underlying illnesses.  The most common symptoms associated with an upper respiratory infection are nasal discharge or congestion, cough, sneezing, headache and pressure in the ears and face.  These symptoms usually persist for about 3 to 10 days, but can last up to 2 weeks.  It is important to know that upper respiratory infections do not cause serious illness or complications in most cases.    Upper respiratory infections can be transmitted from person to person, with the most common method of transmission being a person's hands.  The virus is able to live on the skin and can infect other persons for up to 2 hours after direct contact.  Also, these can be transmitted when someone coughs or sneezes; thus, it is important to cover the mouth to reduce this risk.  To keep the spread of the illness at bay, good hand hygiene is very important.  This is an infection that is most likely caused by a virus. There are no specific treatments other than to help you with the symptoms until the infection runs its course.  We are sorry you are not feeling well.  Here is how we plan to help!   For nasal congestion, you may use an oral decongestants such as Mucinex D or if you have glaucoma or high blood pressure use plain Mucinex.  Saline nasal spray or nasal drops can help and can safely be used as often as  needed for congestion.  For your congestion, I have prescribed Azelastine nasal spray two sprays in each nostril twice a day add this to Flonase unless you are using this then pick up Flonase and use them in combination.  If you do not have a history of heart disease, hypertension, diabetes or thyroid disease, prostate/bladder issues or glaucoma, you may also use Sudafed to treat nasal congestion.  It is highly recommended that you consult with a pharmacist or your primary care physician to ensure this medication is safe for you to take.     If you have a cough, you may use cough suppressants such as Delsym and Robitussin.  If you have glaucoma or high blood pressure, you can also use Coricidin HBP.   For cough I have prescribed for you A prescription cough medication called Tessalon Perles 100 mg. You may take 1-2 capsules every 8 hours as needed for cough   I will order you an albuterol inhaler as well as a cough syrup to help with both congestion and cough.  If you have a sore or scratchy throat, use a saltwater gargle-  to  teaspoon of salt dissolved in a 4-ounce to 8-ounce glass of warm water.  Gargle the solution for approximately 15-30 seconds and then spit.  It is important not to swallow the solution.  You can also use throat lozenges/cough drops and Chloraseptic spray  to help with throat pain or discomfort.  Warm or cold liquids can also be helpful in relieving throat pain.  For headache, pain or general discomfort, you can use Ibuprofen or Tylenol as directed.   Some authorities believe that zinc sprays or the use of Echinacea may shorten the course of your symptoms.   HOME CARE Only take medications as instructed by your medical team. Be sure to drink plenty of fluids. Water is fine as well as fruit juices, sodas and electrolyte beverages. You may want to stay away from caffeine or alcohol. If you are nauseated, try taking small sips of liquids. How do you know if you are getting  enough fluid? Your urine should be a pale yellow or almost colorless. Get rest. Taking a steamy shower or using a humidifier may help nasal congestion and ease sore throat pain. You can place a towel over your head and breathe in the steam from hot water coming from a faucet. Using a saline nasal spray works much the same way. Cough drops, hard candies and sore throat lozenges may ease your cough. Avoid close contacts especially the very young and the elderly Cover your mouth if you cough or sneeze Always remember to wash your hands.   GET HELP RIGHT AWAY IF: You develop worsening fever. If your symptoms do not improve within 10 days You develop yellow or green discharge from your nose over 3 days. You have coughing fits You develop a severe head ache or visual changes. You develop shortness of breath, difficulty breathing or start having chest pain Your symptoms persist after you have completed your treatment plan  MAKE SURE YOU  Understand these instructions. Will watch your condition. Will get help right away if you are not doing well or get worse.  Thank you for choosing an e-visit.  Your e-visit answers were reviewed by a board certified advanced clinical practitioner to complete your personal care plan. Depending upon the condition, your plan could have included both over the counter or prescription medications.  Please review your pharmacy choice. Make sure the pharmacy is open so you can pick up prescription now. If there is a problem, you may contact your provider through [employer] and have the prescription routed to another pharmacy.  Your safety is important to [place]. If you have drug allergies check your prescription carefully.   For the next 24 hours you can use MyChart to ask questions about today's visit, request a non-urgent call back, or ask for a work or school excuse. You will get an email in the next two days asking about your experience. I hope that your  e-visit has been valuable and will speed your recovery.    I provided 5 minutes of non face-to-face time during this encounter for chart review, medication and order placement, as well as and documentation.

## 2022-05-14 ENCOUNTER — Telehealth: Payer: Self-pay | Admitting: Physician Assistant

## 2022-05-14 DIAGNOSIS — N760 Acute vaginitis: Secondary | ICD-10-CM

## 2022-05-14 DIAGNOSIS — B9689 Other specified bacterial agents as the cause of diseases classified elsewhere: Secondary | ICD-10-CM

## 2022-05-14 MED ORDER — METRONIDAZOLE 500 MG PO TABS: 500.0000 mg | ORAL_TABLET | Freq: Two times a day (BID) | ORAL | 0 refills | Status: AC

## 2022-05-14 NOTE — Progress Notes (Signed)

## 2022-05-14 NOTE — Progress Notes (Signed)
I have spent 5 minutes in review of e-visit questionnaire, review and updating patient chart, medical decision making and response to patient.   Braelin Brosch Cody Berel Najjar, PA-C    

## 2022-08-19 ENCOUNTER — Telehealth: Payer: Self-pay | Admitting: Physician Assistant

## 2022-08-19 DIAGNOSIS — J020 Streptococcal pharyngitis: Secondary | ICD-10-CM

## 2022-08-19 MED ORDER — AMOXICILLIN 500 MG PO CAPS: 500.0000 mg | ORAL_CAPSULE | Freq: Two times a day (BID) | ORAL | 0 refills | Status: AC

## 2022-08-19 NOTE — Progress Notes (Signed)

## 2023-07-04 ENCOUNTER — Telehealth: Payer: 59 | Admitting: Physician Assistant

## 2023-07-04 DIAGNOSIS — J069 Acute upper respiratory infection, unspecified: Secondary | ICD-10-CM

## 2023-07-04 MED ORDER — BENZONATATE 100 MG PO CAPS: 100.0000 mg | ORAL_CAPSULE | Freq: Three times a day (TID) | ORAL | 0 refills | Status: DC | PRN

## 2023-07-04 MED ORDER — FLUTICASONE PROPIONATE 50 MCG/ACT NA SUSP: 2.0000 | Freq: Every day | NASAL | 0 refills | Status: AC

## 2023-07-04 NOTE — Progress Notes (Signed)
I have spent 5 minutes in review of e-visit questionnaire, review and updating patient chart, medical decision making and response to patient.   Piedad Climes, PA-C

## 2023-07-04 NOTE — Progress Notes (Signed)

## 2023-07-08 ENCOUNTER — Telehealth: Payer: 59 | Admitting: Physician Assistant

## 2023-07-08 DIAGNOSIS — J208 Acute bronchitis due to other specified organisms: Secondary | ICD-10-CM

## 2023-07-08 MED ORDER — ALBUTEROL SULFATE HFA 108 (90 BASE) MCG/ACT IN AERS: 1.0000 | INHALATION_SPRAY | Freq: Four times a day (QID) | RESPIRATORY_TRACT | 0 refills | Status: AC | PRN

## 2023-07-08 MED ORDER — PREDNISONE 20 MG PO TABS: 40.0000 mg | ORAL_TABLET | Freq: Every day | ORAL | 0 refills | Status: AC

## 2023-07-08 MED ORDER — BENZONATATE 100 MG PO CAPS: 100.0000 mg | ORAL_CAPSULE | Freq: Three times a day (TID) | ORAL | 0 refills | Status: AC | PRN

## 2023-07-08 NOTE — Progress Notes (Signed)
E-Visit for Cough   We are sorry that you are not feeling well.  Here is how we plan to help!  Based on your presentation I believe you most likely have A cough due to a virus.  This is called viral bronchitis and is best treated by rest, plenty of fluids and control of the cough.  You may use Ibuprofen or Tylenol as directed to help your symptoms.     In addition you may use A non-prescription cough medication called Mucinex DM: take 2 tablets every 12 hours. and A prescription cough medication called Tessalon Perles 100mg . You may take 1-2 capsules every 8 hours as needed for your cough.  I am prescribing:  Prednisone 20mg  Take 2 tablets (40mg ) daily for 5 days. AND Albuterol inhaler Use 1-2 puffs every 6 hours as needed for shortness of breath, chest tightness, and/or wheezing.\  From your responses in the eVisit questionnaire you describe inflammation in the upper respiratory tract which is causing a significant cough.  This is commonly called Bronchitis and has four common causes:   Allergies Viral Infections Acid Reflux Bacterial Infection Allergies, viruses and acid reflux are treated by controlling symptoms or eliminating the cause. An example might be a cough caused by taking certain blood pressure medications. You stop the cough by changing the medication. Another example might be a cough caused by acid reflux. Controlling the reflux helps control the cough.  USE OF BRONCHODILATOR ("RESCUE") INHALERS: There is a risk from using your bronchodilator too frequently.  The risk is that over-reliance on a medication which only relaxes the muscles surrounding the breathing tubes can reduce the effectiveness of medications prescribed to reduce swelling and congestion of the tubes themselves.  Although you feel brief relief from the bronchodilator inhaler, your asthma may actually be worsening with the tubes becoming more swollen and filled with mucus.  This can delay other crucial  treatments, such as oral steroid medications. If you need to use a bronchodilator inhaler daily, several times per day, you should discuss this with your provider.  There are probably better treatments that could be used to keep your asthma under control.     HOME CARE Only take medications as instructed by your medical team. Complete the entire course of an antibiotic. Drink plenty of fluids and get plenty of rest. Avoid close contacts especially the very young and the elderly Cover your mouth if you cough or cough into your sleeve. Always remember to wash your hands A steam or ultrasonic humidifier can help congestion.   GET HELP RIGHT AWAY IF: You develop worsening fever. You become short of breath You cough up blood. Your symptoms persist after you have completed your treatment plan MAKE SURE YOU  Understand these instructions. Will watch your condition. Will get help right away if you are not doing well or get worse.    Thank you for choosing an e-visit.  Your e-visit answers were reviewed by a board certified advanced clinical practitioner to complete your personal care plan. Depending upon the condition, your plan could have included both over the counter or prescription medications.  Please review your pharmacy choice. Make sure the pharmacy is open so you can pick up prescription now. If there is a problem, you may contact your provider through [employer] and have the prescription routed to another pharmacy.  Your safety is important to [place]. If you have drug allergies check your prescription carefully.   For the next 24 hours you can use  MyChart to ask questions about today's visit, request a non-urgent call back, or ask for a work or school excuse. You will get an email in the next two days asking about your experience. I hope that your e-visit has been valuable and will speed your recovery.    I have spent 5 minutes in review of e-visit questionnaire, review and  updating patient chart, medical decision making and response to patient.   Margaretann Loveless, PA-C

## 2024-03-29 ENCOUNTER — Telehealth: Admitting: Nurse Practitioner

## 2024-03-29 DIAGNOSIS — J020 Streptococcal pharyngitis: Secondary | ICD-10-CM | POA: Diagnosis not present

## 2024-03-29 MED ORDER — AMOXICILLIN 500 MG PO CAPS: 500.0000 mg | ORAL_CAPSULE | Freq: Two times a day (BID) | ORAL | 0 refills | Status: AC

## 2024-03-29 NOTE — Progress Notes (Signed)

## 2024-06-14 ENCOUNTER — Telehealth: Admitting: Family

## 2024-06-14 DIAGNOSIS — J029 Acute pharyngitis, unspecified: Secondary | ICD-10-CM

## 2024-06-14 MED ORDER — AMOXICILLIN 500 MG PO CAPS: 500.0000 mg | ORAL_CAPSULE | Freq: Two times a day (BID) | ORAL | 0 refills | Status: AC

## 2024-06-14 NOTE — Progress Notes (Signed)
